# Patient Record
Sex: Male | Born: 1945 | Race: White | Hispanic: No | Marital: Married | Smoking: Former smoker
Health system: Southern US, Community
[De-identification: ages and names within clinical notes are randomized; demographics above are authoritative.]

## PROBLEM LIST (undated history)

## (undated) DIAGNOSIS — M549 Dorsalgia, unspecified: Secondary | ICD-10-CM

## (undated) DIAGNOSIS — F341 Dysthymic disorder: Secondary | ICD-10-CM

## (undated) DIAGNOSIS — I10 Essential (primary) hypertension: Secondary | ICD-10-CM

## (undated) DIAGNOSIS — M5137 Other intervertebral disc degeneration, lumbosacral region: Secondary | ICD-10-CM

## (undated) DIAGNOSIS — F329 Major depressive disorder, single episode, unspecified: Secondary | ICD-10-CM

## (undated) DIAGNOSIS — K635 Polyp of colon: Secondary | ICD-10-CM

## (undated) DIAGNOSIS — F419 Anxiety disorder, unspecified: Secondary | ICD-10-CM

## (undated) DIAGNOSIS — K219 Gastro-esophageal reflux disease without esophagitis: Secondary | ICD-10-CM

## (undated) DIAGNOSIS — E119 Type 2 diabetes mellitus without complications: Secondary | ICD-10-CM

## (undated) DIAGNOSIS — M48061 Spinal stenosis, lumbar region without neurogenic claudication: Secondary | ICD-10-CM

## (undated) DIAGNOSIS — I471 Supraventricular tachycardia: Secondary | ICD-10-CM

## (undated) DIAGNOSIS — C3491 Malignant neoplasm of unspecified part of right bronchus or lung: Secondary | ICD-10-CM

## (undated) DIAGNOSIS — Q2381 Bicuspid aortic valve: Secondary | ICD-10-CM

## (undated) DIAGNOSIS — C44321 Squamous cell carcinoma of skin of nose: Secondary | ICD-10-CM

## (undated) DIAGNOSIS — I428 Other cardiomyopathies: Secondary | ICD-10-CM

## (undated) DIAGNOSIS — M2550 Pain in unspecified joint: Secondary | ICD-10-CM

## (undated) DIAGNOSIS — G894 Chronic pain syndrome: Secondary | ICD-10-CM

## (undated) DIAGNOSIS — M51379 Other intervertebral disc degeneration, lumbosacral region without mention of lumbar back pain or lower extremity pain: Secondary | ICD-10-CM

## (undated) DIAGNOSIS — G4730 Sleep apnea, unspecified: Secondary | ICD-10-CM

## (undated) DIAGNOSIS — K649 Unspecified hemorrhoids: Secondary | ICD-10-CM

## (undated) DIAGNOSIS — Q231 Congenital insufficiency of aortic valve: Secondary | ICD-10-CM

## (undated) DIAGNOSIS — E559 Vitamin D deficiency, unspecified: Secondary | ICD-10-CM

## (undated) DIAGNOSIS — R739 Hyperglycemia, unspecified: Secondary | ICD-10-CM

## (undated) DIAGNOSIS — E785 Hyperlipidemia, unspecified: Secondary | ICD-10-CM

## (undated) DIAGNOSIS — I4719 Other supraventricular tachycardia: Secondary | ICD-10-CM

## (undated) HISTORY — DX: Hyperlipidemia, unspecified: E78.5

## (undated) HISTORY — DX: Congenital insufficiency of aortic valve: Q23.1

## (undated) HISTORY — DX: Other supraventricular tachycardia: I47.19

## (undated) HISTORY — DX: Chronic pain syndrome: G89.4

## (undated) HISTORY — DX: Unspecified hemorrhoids: K64.9

## (undated) HISTORY — DX: Major depressive disorder, single episode, unspecified: F32.9

## (undated) HISTORY — DX: Hyperglycemia, unspecified: R73.9

## (undated) HISTORY — PX: OTHER SURGICAL HISTORY: SHX169

## (undated) HISTORY — DX: Supraventricular tachycardia: I47.1

## (undated) HISTORY — DX: Dysthymic disorder: F34.1

## (undated) HISTORY — DX: Vitamin D deficiency, unspecified: E55.9

## (undated) HISTORY — DX: Spinal stenosis, lumbar region without neurogenic claudication: M48.061

## (undated) HISTORY — DX: Gastro-esophageal reflux disease without esophagitis: K21.9

## (undated) HISTORY — DX: Other cardiomyopathies: I42.8

## (undated) HISTORY — DX: Bicuspid aortic valve: Q23.81

## (undated) HISTORY — DX: Type 2 diabetes mellitus without complications: E11.9

## (undated) HISTORY — DX: Pain in unspecified joint: M25.50

## (undated) HISTORY — DX: Sleep apnea, unspecified: G47.30

## (undated) HISTORY — DX: Polyp of colon: K63.5

## (undated) HISTORY — DX: Squamous cell carcinoma of skin of nose: C44.321

## (undated) HISTORY — DX: Other intervertebral disc degeneration, lumbosacral region: M51.37

## (undated) HISTORY — DX: Malignant neoplasm of unspecified part of right bronchus or lung: C34.91

## (undated) HISTORY — PX: CAROTID ENDARTERECTOMY: SUR193

## (undated) HISTORY — DX: Anxiety disorder, unspecified: F41.9

## (undated) HISTORY — DX: Other intervertebral disc degeneration, lumbosacral region without mention of lumbar back pain or lower extremity pain: M51.379

## (undated) HISTORY — DX: Essential (primary) hypertension: I10

## (undated) HISTORY — DX: Dorsalgia, unspecified: M54.9

---

## 1996-03-06 HISTORY — PX: CHOLECYSTECTOMY: SHX55

## 2001-03-06 HISTORY — PX: CAROTID ENDARTERECTOMY: SUR193

## 2003-09-21 ENCOUNTER — Inpatient Hospital Stay (HOSPITAL_COMMUNITY): Admission: RE | Admit: 2003-09-21 | Discharge: 2003-09-22 | Payer: Self-pay | Admitting: Vascular Surgery

## 2003-09-21 ENCOUNTER — Encounter (INDEPENDENT_AMBULATORY_CARE_PROVIDER_SITE_OTHER): Payer: Self-pay | Admitting: *Deleted

## 2004-09-07 HISTORY — PX: CARDIAC CATHETERIZATION: SHX172

## 2005-02-07 ENCOUNTER — Ambulatory Visit: Payer: Self-pay | Admitting: Internal Medicine

## 2005-02-07 ENCOUNTER — Inpatient Hospital Stay (HOSPITAL_BASED_OUTPATIENT_CLINIC_OR_DEPARTMENT_OTHER): Admission: RE | Admit: 2005-02-07 | Discharge: 2005-02-07 | Payer: Self-pay | Admitting: Cardiology

## 2005-02-21 ENCOUNTER — Ambulatory Visit: Payer: Self-pay | Admitting: Internal Medicine

## 2005-03-03 ENCOUNTER — Inpatient Hospital Stay (HOSPITAL_COMMUNITY): Admission: RE | Admit: 2005-03-03 | Discharge: 2005-03-05 | Payer: Self-pay | Admitting: Internal Medicine

## 2005-03-03 HISTORY — PX: INSERT / REPLACE / REMOVE PACEMAKER: SUR710

## 2005-03-03 HISTORY — PX: TEE WITH CARDIOVERSION: SHX5442

## 2005-05-05 ENCOUNTER — Ambulatory Visit: Payer: Self-pay | Admitting: Internal Medicine

## 2005-07-10 ENCOUNTER — Inpatient Hospital Stay (HOSPITAL_COMMUNITY): Admission: AD | Admit: 2005-07-10 | Discharge: 2005-07-13 | Payer: Self-pay | Admitting: Cardiology

## 2007-01-04 IMAGING — CR DG CHEST 2V
2 series · 2 of 2 positions shown · non-contrast
Comparison: 09/17/03.

CLINICAL DATA: 59-year-old male with recurrent atrial tachycardia.
 CHEST ? 2 VIEW:

[view not recorded (1 of 2)]
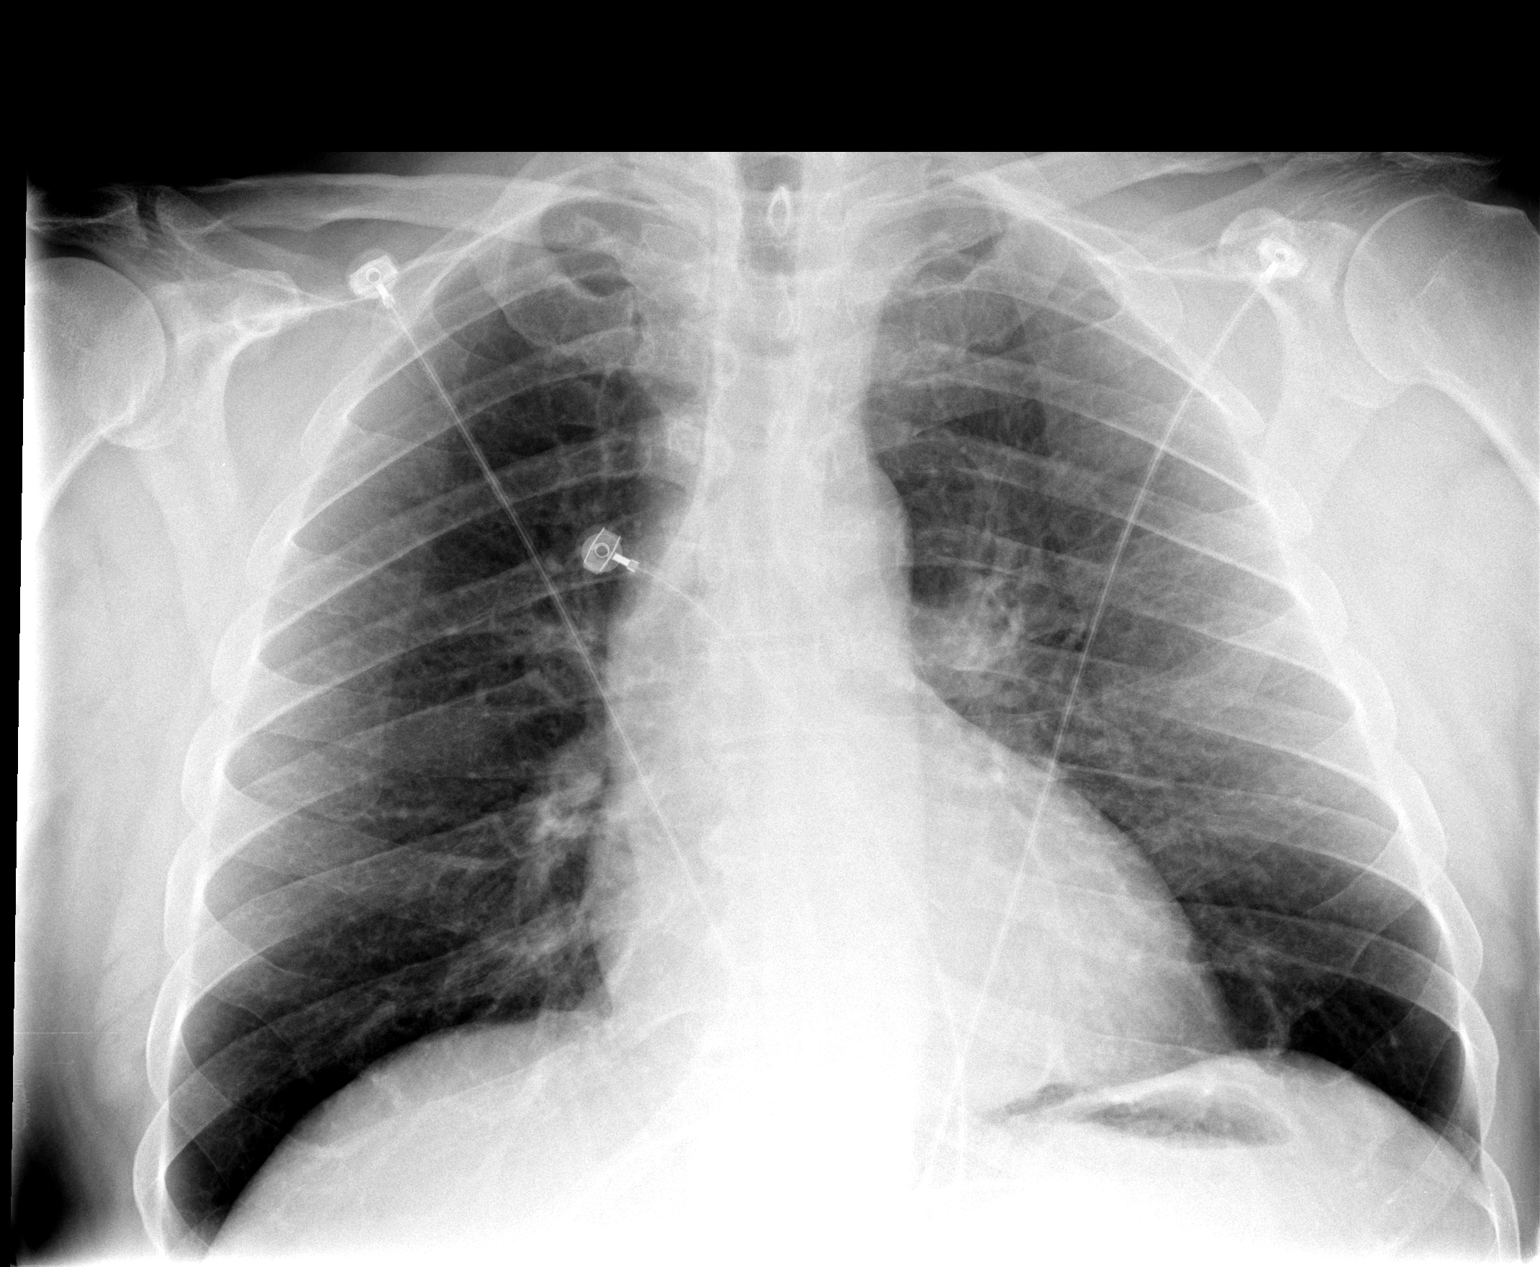

[view not recorded (2 of 2)]
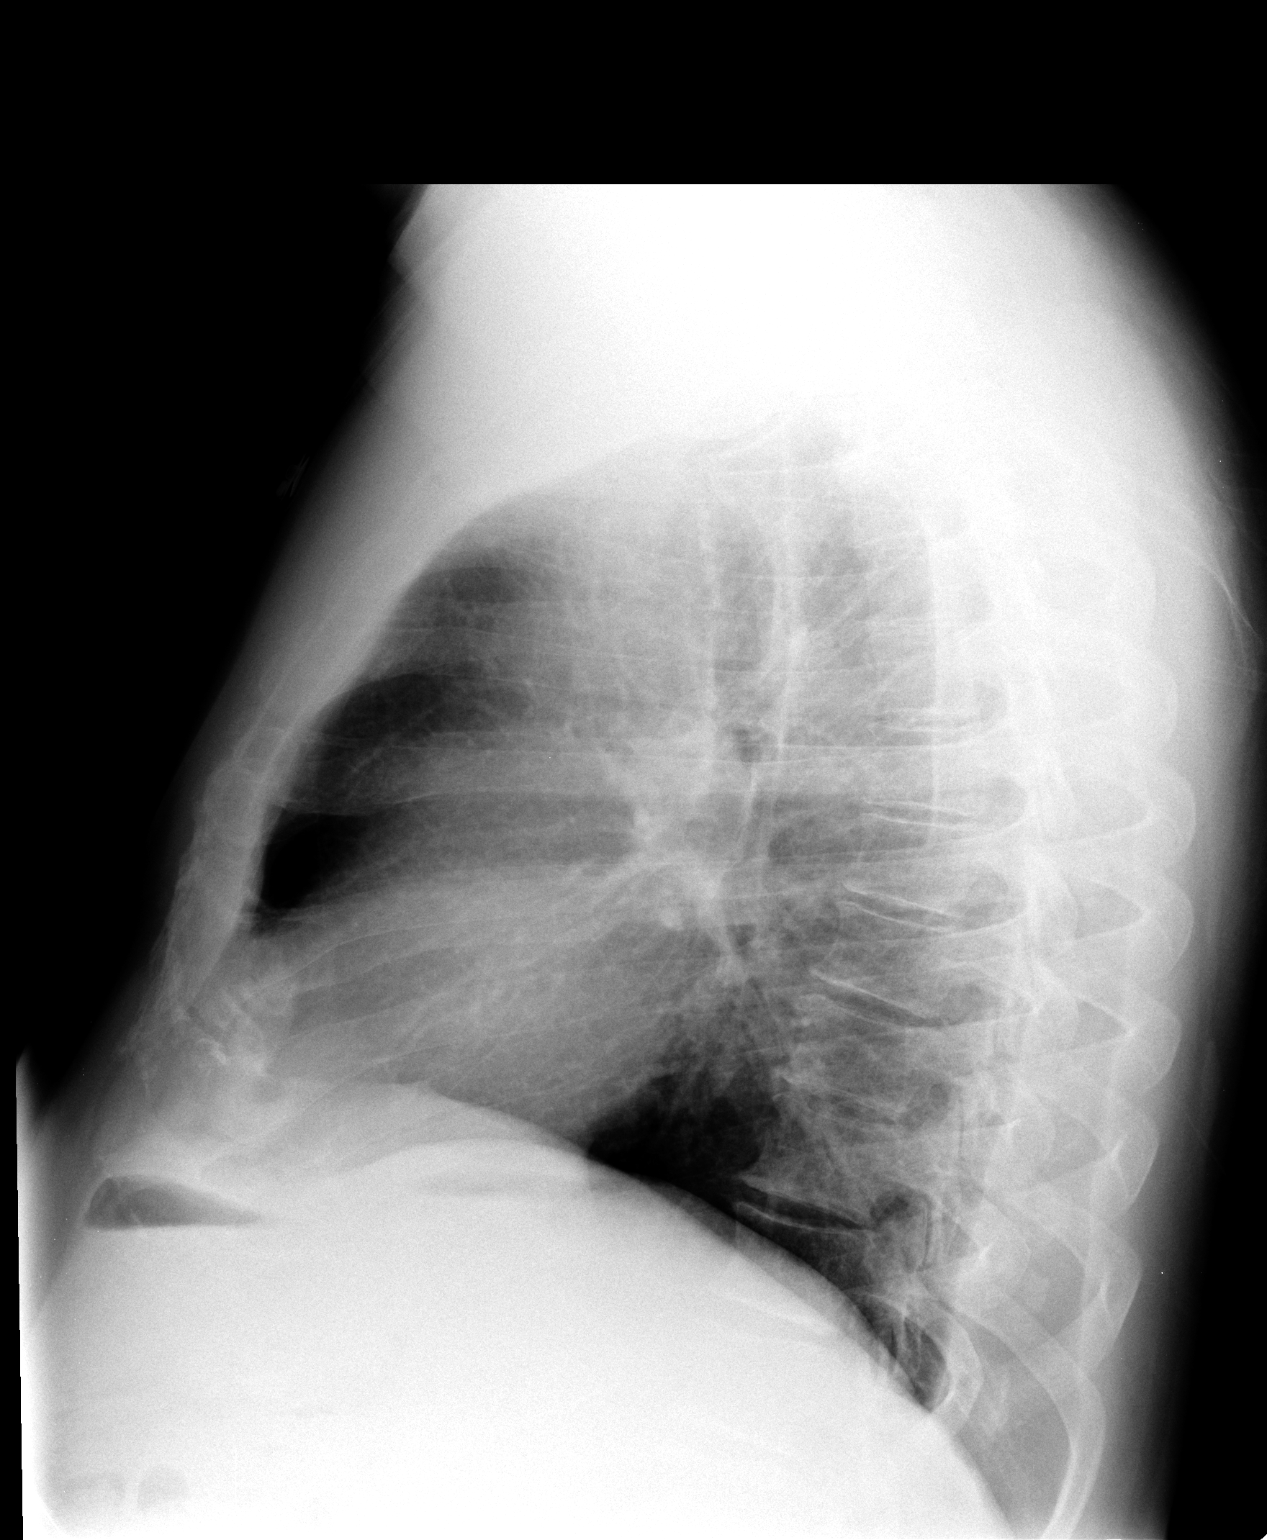

[2 of 2 positions shown; findings below may reference images not displayed]

FINDINGS: Mild hyperinflation and interstitial prominence.  These findings are stable.  Normal heart size.  No CHF, pneumonia, effusion, or pneumothorax.
IMPRESSION: Stable exam.  No acute chest disease.

## 2009-11-23 ENCOUNTER — Ambulatory Visit: Payer: Self-pay | Admitting: Cardiology

## 2009-11-23 ENCOUNTER — Ambulatory Visit (HOSPITAL_COMMUNITY): Admission: RE | Admit: 2009-11-23 | Discharge: 2009-11-23 | Payer: Self-pay | Admitting: Cardiology

## 2010-07-22 NOTE — Cardiovascular Report (Signed)
NAME:  Tanner Williamson, FULL NO.:  [id number]   MEDICAL RECORD NO.:  [id number]          PATIENT TYPE:  OIB   LOCATION:  1961                         FACILITY:  [facility]   PHYSICIAN:  Peter M. Swaziland, M.D.  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   PROCEDURES:  1.  Left heart catheterization coronary.  2.  Left ventricular angiography.   EQUIPMENT USED:  A 4-French 5-cm left coronary catheter, a 4-French 4-cm  right Judkins catheter, a 4-French pigtail catheter, a 4-French arterial  sheath.   ACCESS:  Via right femoral artery using standard Seldinger technique.   MEDICATIONS:  Local anesthesia 1% Xylocaine.   CONTRAST:  Omnipaque 90 cc.   HEMODYNAMIC DATA:  1.  Aortic pressure was 109/50 with a mean of 72-mmHg.  2.  Left ventricular pressures 110 with an EDP of 10-mmHg.   ANGIOGRAPHIC DATA:  1.  The left coronary arises and distributes normally.  2.  The left main coronary is normal.  3.  Left anterior descending artery has mild 20% disease proximally.  There      are minor irregularities less than 10% throughout vessel.  4.  The left circumflex coronary is normal.  5.  Right coronary has 10-20% irregularities throughout the vessel but no      obstructive disease noted.   LEFT VENTRICULAR ANGIOGRAPHY:  Was performed in RAO view.  This demonstrates  mild left ventricular dilatation.  There is global hypokinesia with overall  ejection fraction estimated at 40%.  There is no mitral regurgitation.   FINAL INTERPRETATION:  1.  No significant obstructive coronary disease.  2.  Moderate left ventricular dysfunction.   PLAN:  1.  Will continue medical therapy.  2.  Consider for EP  evaluation.           ______________________________  Peter M. Swaziland, M.D.     PMJ/MEDQ  D:  02/07/2005  T:  02/07/2005  Job:  [id number]   cc:   William Dalton, Dr.  [facility]   Quita Skye. Hart Rochester, M.D.  [address]  [place]  [place] [address]   Duke Salvia, M.D.  [address]  [address]  [place]  [place] [address]

## 2010-07-22 NOTE — Op Note (Signed)
NAMEGRADY, Tanner Williamson NO.:  [id number]   MEDICAL RECORD NO.:  [id number]          PATIENT TYPE:  INP   LOCATION:  2309                         FACILITY:  [facility]   PHYSICIAN:  Duke Salvia, M.D.  DATE OF BIRTH:  December 31, 1945   DATE OF PROCEDURE:  03/03/2005  DATE OF DISCHARGE:                                 OPERATIVE REPORT   PREPROCEDURE DIAGNOSIS:  Supraventricular long __________  tachycardia.   POSTPROCEDURE DIAGNOSIS:  Septal atrial tachycardia.   PROCEDURES PERFORMED:  1.  Invasive electrophysiological study.  2.  Arrhythmia mapping.  3.  Radiofrequency catheter ablation.   CARDIOLOGIST:  Duke Salvia, M.D.   COMPLICATIONS:  Transient complete heart block and with persistent  Wenckebach, and first degree AV block.   DESCRIPTION OF PROCEDURE:  Following the obtainment of informed consent the  patient was brought to the electrophysiology laboratory and placed on  fluoroscopic table in the supine position.  After cardiac catheterization  was performed local anesthesia and conscious sedation noninvasive blood  pressure monitoring, transcutaneous oxygen saturation monitoring and end-  tidal CO2 monitoring were performed continuously throughout the procedure.   Following the procedure the right-sided sheaths were removed in anticipation  of temporary transvenous pacing.   CATHETERS:  Five French quadripolar catheter was inserted via the left  femoral vein to the AV junction  __________  electrogram.  Six French  octapolar catheter was inserted via the left femoral vein to the right  ventricular apex.  A 7 French octapolar catheter was inserted into the right  femoral vein to the coronary sinus.  An 8 [place] octapolar catheter was  inserted via the left femoral vein to the tricuspid annulus.  An 8 French 5  mm __________  ablation catheter was inserted into the right femoral vein to  mapping sites in the posterior septal and midseptal spaces.  Surface  leads  1, aVF and V1 were monitored continuously throughout the procedure.   Following insertion of the catheters the stimulation protocol included  incremental atrial pacing. Atrial overdrive pacing.  Ventricular  stimulation.   RESULTS:   SURFACE ELECTROCARDIOGRAM:  Surface electrocardiogram at basic intervals.  Initial rhythm - atrial tachycardia.  RR interval 477 msec.  PR interval 115 msec.  QRS duration 97 msec.  QT interval 306 msec.  P-wave duration 87 msec.  H-A interval was __________  msec.  H-V interval was 56 msec.  His bundle duration was 16 msec.   FINAL:  Rhythm - atrial tachycardia with Mobitz's I heart block.  RR interval 638 msec.  PR interval as noted above with minimal p.r.n. interval at 162 msec.  QRS duration 95 msec.  QT interval 350 msec.  P wave 118 msec.  A-H interval and/A interval msec.   AV Wenckebach preprocedure was noted to be right around 600; intermittently  between 500 and 600 msec with occasional dropped beats.   Following the procedure A-V nodal conduction had four to three Wenckebach at  the atrial cycle length of 477 msec particularly with the patient sedated;  with the patient more awake 1:1 conduction was seen.  Following the procedure ventricular conduction was dissociated.   Arrhythmias induced:  An atrial tachycardia was identified in the low-to-mid  septum just cephalad to the coronary sinus os.  At this location the P wave  was -40 msec to the onset of the surface QRS.  Lower down the was an R wave  on the unipolar electrogram.  As we moved more to the midseptum the R wave  was lost, although there was some flattening of  the Q wave.  Fluoroscopy  demonstrated that we were about 1 cm away from the His recording.   Radiofrequency energy was applied at this location with gradual up titration  of temperature starting at 540 degrees.  A couple of occasional dropped  beats were seen; however, as we had seen these preablation  also  radiofrequency was continued for another five seconds.  At 9, 16, 34-35 we  then had consecutively dropped P waves and radiofrequency energy was  discontinued within a second.  Complete heart block was manifested.  There  was recovery of conduction that has been variable since then.   IMPRESSION:  1.  Atrial tachycardia - incessant, septally located.  2.  Unknown sinus function.  3.  Initial antegrade A-V nodal function was relatively normal with 1:1      conduction at the start of the case, although variably we saw dropped      beats and evidence of some Wenckebach. __________ .  4.  Radiofrequency energy clearly damaged the A-V nodal inputs with      transient complete heart block and then subsequent Mobitz's I.  5.  Normal His Purkinje system function.  6.  No accessory pathway.  7.  Normal ventricular response to programmed stimulation.   SUMMARY AND CONCLUSION:  The results of the electrophysiological testing  confirmed an atrial tachycardia that was originating from the septum  somewhere just inferior to the His and superior to the coronary sinus os.  Unfortunately applications of radiofrequency energy in this location where  he had electrogram -40 with the onset of surface P was associated with  damage to the antegrade A-V nodal conduction.   Because of that we will plan to place a temporary transvenous pacemaker as  we will have to watch to see how it is if this recovers.   The patient tolerated the procedure otherwise without difficulty.           ______________________________  Duke Salvia, M.D.     SCK/MEDQ  D:  03/03/2005  T:  03/04/2005  Job:  [id number]   cc:   Peter M. Swaziland, M.D.  Fax: [phone]   Electrophysiology Laboratory

## 2010-07-22 NOTE — Op Note (Signed)
NAME:  Tanner Williamson, Tanner Williamson                         ACCOUNT NO.:  [id number]   MEDICAL RECORD NO.:  [id number]                   PATIENT TYPE:  INP   LOCATION:  2899                                 FACILITY:  [facility]   PHYSICIAN:  Tanner Williamson, M.D.               DATE OF BIRTH:  1945/04/03   DATE OF PROCEDURE:  09/21/2003  DATE OF DISCHARGE:                                 OPERATIVE REPORT   PREOPERATIVE DIAGNOSIS:  Severe left internal carotid stenosis --  asymptomatic.   POSTOPERATIVE DIAGNOSIS:  Severe left internal carotid stenosis --  asymptomatic.   OPERATION:  Left carotid endarterectomy with Dacron patch angioplasty.   SURGEON:  Tanner Williamson, M.D.   FIRST ASSISTANT:  Carolyn A. Eustaquio Boyden.   ANESTHESIA:  General endotracheal.   BRIEF HISTORY:  This patient was found to have a left carotid bruits which  radiated noise into his left ear.  He subsequently underwent Duplex  scanning, which revealed a severe left internal carotid stenosis  approximating 90%, which was asymptomatic otherwise.  He had mild right  carotid occlusive disease and scheduled for left carotid endarterectomy.   PROCEDURE:  The patient was taken to the operating room and placed in a  supine position, at which time satisfactory general endotracheal anesthesia  was administered.  The left neck was prepped with Betadine scrubbing  solution and draped in routine sterile manner.  An incision was made along  the anterior border of the sternocleidomastoid muscle and carried down  through subcutaneous tissue and platysma using a Bovie.  The common facial  vein and external jugular vein were ligated with 3-0 silk ties and divided,  exposing the common, internal and external carotid arteries.  Care was taken  not to injure the vagus or hypoglossal nerves, both of which were exposed.  There was a calcified atherosclerotic plaque at the carotid bifurcation,  extending up the internal carotid about 3-4 cm; the  distal vessel appeared  normal.  A #10 shunt was prepared and the patient was heparinized.  Carotid  vessels were occluded with vascular clamps and a longitudinal opening made  in the common carotid with a 15 blade and extended up the internal carotid  with the Potts scissors to a point distal to the disease.  The plaque was  severely stenotic and ulcerated, but the distal vessel appeared normal.  A  #10 shunt was inserted without difficulty, reestablishing flow in about 2  minutes.  Standard endarterectomy was then performed using the elevator and  the Potts scissors with an eversion endarterectomy of the external carotid.  The plaque feathered off the distal internal carotid artery nicely, not  requiring any tacking sutures.  The lumen was thoroughly irrigated with  heparin and saline and all loose debris carefully removed and arteriotomy  was closed with a patch using continuous 6-0 Prolene.  Prior to completion  of the closure, the shunt was  removed after about 30 minutes of shunt time.  Following antegrade and retrograde flushing the closure was completed with  reestablishment of flow initially up the external and up the  internal branch.  Carotid was occluded for less than 2 minutes for removal  of the shunt.  Protamine was the given to reverse the heparin.  Following  adequate hemostasis, the wound was irrigated with saline, closed in layers  with Vicryl in a subcuticular fashion, sterile dressing applied and patient  taken to recovery room in satisfactory condition.                                               Tanner Skye Hart Williamson, M.D.    JDL/MEDQ  D:  09/21/2003  T:  09/21/2003  Job:  [id number]

## 2010-07-22 NOTE — Op Note (Signed)
NAMEJOSEHUA, HAMMAR NO.:  [id number]   MEDICAL RECORD NO.:  [id number]          PATIENT TYPE:  INP   LOCATION:  2309                         FACILITY:  [facility]   PHYSICIAN:  Duke Salvia, M.D.  DATE OF BIRTH:  10-08-1945   DATE OF PROCEDURE:  03/03/2005  DATE OF DISCHARGE:                                 OPERATIVE REPORT   PROCEDURE PERFORMED:  Temporary permanent transvenous pacemaker insertion.   CARDIOLOGIST:  Duke Salvia, M.D.   DESCRIPTION OF THE PROCEDURE:  Following the aforementioned complication of  iatrogenic heart block the sheaths from the right femoral venous area were  sterilized and removed.  A 7 French tearaway introducer sheath was placed  through which was then passed a St. Jude 1688T, 100 cm active fixation  ventricular lead , serial number [id number].  It was placed in the right  ventricular apex where the bipolar R wave with 11 mVolts and a pacing  impedance of 10, 24 ohms with a threshold of 1 volt at 0.5 msec.  It was  attached to a temporary permanent Identity XL pulse generator that was  programmed VVI at 30 beats/minute.  The lead was secured to the skin.  OpSite dressing was applied.   The patient was then transferred to the holding area in stable condition.   Antibiotics will be administered.  We will plan to watch the patient over  the next 24-48 hours to see how it is that the A-V nodal conduction  recovers.           ______________________________  Duke Salvia, M.D.     SCK/MEDQ  D:  03/03/2005  T:  03/04/2005  Job:  [id number]

## 2010-07-22 NOTE — Discharge Summary (Signed)
NAMEJOWEL, [name] NO.:  [id number]   MEDICAL RECORD NO.:  [id number]          PATIENT TYPE:  INP   LOCATION:  2309                         FACILITY:  [facility]   PHYSICIAN:  Duke Salvia, M.D.  DATE OF BIRTH:  February 16, 1946   DATE OF ADMISSION:  03/03/2005  DATE OF DISCHARGE:  03/05/2005                                 DISCHARGE SUMMARY   PRINCIPAL DIAGNOSES:  1.  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  2.  Septal atrial tachycardia.  3.  Status post electrophysiology/radiofrequency catheter ablation with      transient damage to the AV nodal conduction system.  4.  Status post implant of temporary permanent transvenous pacemaker      December 29.  5.  Referred post procedure to [facility].  6.  No rate-controlling drugs at discharge.   PROCEDURES:  1.  March 03, 2006, electrophysiology study, attempted radiofrequency      catheter ablation of septal atrial tachycardia, aborted secondary to      transient AV nodal injury.  2.  Insertion of temporary permanent transvenous pacemaker, Duke Salvia,      M.D., March 03, 2006.   BRIEF HISTORY:  Mr. Tanner Williamson is a 65 year old male.  He has a  longstanding history of tachypalpitations, reflected possibly as a  tachycardia-induced cardiomyopathy.  He presents for electrophysiology study  and radiofrequency catheter ablation of what is probably an atrial  tachycardia.  Risks and benefits of the procedure have been explained to the  patient and he wishes to proceed.   HOSPITAL COURSE:  The patient presented to [facility] electively  March 03, 2005.  He underwent electrophysiology study and radiofrequency  catheter ablation for a septal atrial tachycardia at the same site which was  inferior to the His and superior to the coronary sinus.  There was  origination of his atrial tachycardia; however, radiofrequency energy  in  this area gave associated damage to antegrade AV nodal conduction.  The  patient underwent placement of a temporary permanent transvenous pacemaker  and 36 hours of observation.  After 16 hours' observation, he was  demonstrating 1:1 conduction of a transient complete heart block.  A further  24 hours of observation demonstrated signs of atrial tachycardia with  occasional WCB consistent with preprocedure cardiac function.  There was  also sinus rhythm with first degree AV block with a PR greater than or equal  to 220 msec.  The temporary pacemaker was removed.  The patient had adequate  blood pressure of 110/70, continued independent coordinate cardiac function  and discharged on December 31.  Dr. Sherryl Manges will contact [facility] to involve them in continuance of this patient's  care and atrial tachyarrhythmia.  The patient had concluded a taper of  prednisone to mitigate swelling in the endocardium.   The patient discharges on:   1.  Accupril 10 mg daily.  2.  Aspirin 325 mg daily.  3.  Ultram 50 mg two to three tablets two to three times daily.  4.  Atenolol 25 mg daily.  5.  Cardizem SR 240 mg t.i.d. has been stopped   The patient will follow up with Dr. Graciela Husbands.  His appointments will be made  and also with the [facility].      Tanner Williamson, P.A.    ______________________________  Duke Salvia, M.D.    GM/MEDQ  D:  05/08/2005  T:  05/09/2005  Job:  [id number]   cc:   Peter M. Swaziland, M.D.  Fax: [phone]

## 2010-07-22 NOTE — H&P (Signed)
NAME:  Tanner Williamson, Tanner Williamson                         ACCOUNT NO.:  [id number]   MEDICAL RECORD NO.:  [id number]                   PATIENT TYPE:  INP   LOCATION:  NA                                   FACILITY:  [facility]   PHYSICIAN:  Quita Skye. Hart Rochester, M.D.               DATE OF BIRTH:  1945/07/10   DATE OF ADMISSION:  09/21/2003  DATE OF DISCHARGE:                                HISTORY & PHYSICAL   PRIMARY CARE PHYSICIAN:  Shellia Cleverly, M.D.   This is a 65 year old Caucasian male who was referred from Dr. Shelva Majestic for  evaluation of severe left carotid occlusive disease.  Tanner Williamson presented  with radiation of his heartbeat into his left ear.  A Duplex scan was  performed which revealed severe left internal carotid artery stenosis.  Tanner Williamson  has remained asymptomatic from this.  Tanner Williamson was evaluated by Dr. Hart Rochester of the  CVTS office on August 20, 2003.  Duplex carotid scan was repeated that day and  confirmed a greater than 90% left internal carotid artery stenosis.  Dr.  Hart Rochester recommended left carotid endarterectomy for stroke risk reduction.  The procedure risks and benefits were discussed with Tanner Williamson and Tanner Williamson  agreed to proceed with surgery.   ALLERGIES:  No known drug allergies.  However, Tanner Williamson is intolerant to TYLENOL  NO. 3 which gives him a splitting headache.   MEDICATIONS PRIOR TO ADMISSION:  1. Accupril 10 mg daily.  2. Aspirin 325 mg daily.  3. Ultram 50 mg two daily.  4. Aleve p.r.n.   PAST MEDICAL HISTORY:  1. Hypertension.  2. Longstanding heart murmur, last evaluated with echocardiogram  March     2005.  Ejection fraction estimated to be 55 to 60% with normal     contractility and aortic valve insufficiency.   PAST SURGICAL HISTORY:  1. Cholecystectomy.  2. Colonoscopy.   Tanner Williamson specifically denies any history of coronary disease, diabetes, kidney  disease, thyroid disease or dyslipidemia.   SOCIAL HISTORY:  Tanner Williamson is married.  Tanner Williamson and his wife have one adult  child.  Tanner Williamson is  employed with __________ of [place], [place].  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 Tanner Williamson continues to smoke one to two packs a day  for the last 40 years.  Tanner Williamson has tried to quit in the past.  Tanner Williamson used nicotine  patches with smoking cessation for three months that Tanner Williamson used the patch.  Tanner Williamson  notes that Tanner Williamson would like to restart the nicotine patch during this  hospitalization.  Tanner Williamson lives with his wife in a single level dwelling.  Mrs.  [name] will be available for assistance after discharge from the  hospital.   FAMILY HISTORY:  Negative for coronary disease, stroke or diabetes.   REVIEW OF SYMPTOMS:  Tanner Williamson reports that Tanner Williamson feels pretty good overall,  although the last few months Tanner Williamson has not felt as  peppy as usual.  Tanner Williamson has had  no recent change in vision or hearing.  Tanner Williamson has no difficulties chewing or  swallowing.  Tanner Williamson does hear his heartbeat in his left ear.  Tanner Williamson does not wear  eyeglasses.  Tanner Williamson has dentures on his upper jaw.  No dyspnea on exertion.  No  cough.  Tanner Williamson does sleep with two pills.  No complaints of chest pain. No pedal  edema.  His weight has been stable.  His appetite is good.  Tanner Williamson has had no  recent changes in his bowel habits.  Tanner Williamson follows a regular diet.  No GU  complaints.  No recent dizziness, syncope, no falls.  Tanner Williamson does have some  chronic back pain.  Tanner Williamson does ambulate independently and does not use any  assistive devices.  No complaints of claudication.  No skin rashes or  breakdown.  No recent fevers or infection.   PHYSICAL EXAMINATION:  GENERAL APPEARANCE:  This is a 65 year old Caucasian  male in no acute distress.  Tanner Williamson is pleasant and cooperative with his  examination.  VITAL SIGNS:  Blood pressure 110/68, heart rate 64.  Tanner Williamson is 5 feet 8 inches  tall, weighs approximately 200 pounds.  HEENT:  Eyes:  PERRLA, EOMI.  Oral mucosa is pink and moist.  NECK:  His neck is with a full range of motion.  Tanner Williamson has a left carotid  bruit, no bruit on the right.  No thyromegaly  or lymphadenopathy.  LUNGS:  His breathing is unlabored.  His breath sounds are clear  bilaterally.  CARDIOVASCULAR:  His heart is in a regular rate and rhythm.  There is a 2/6  systolic murmur noted.  ABDOMEN:  His abdomen is rotund with normal active bowel sounds, soft and  nontender.  EXTREMITIES:  Lower extremities are without edema, varicosities or  venostasis changes.  Tanner Williamson has 2+ radial pulses bilaterally.  Tanner Williamson has 2+  posterior tibial pulses bilaterally.  NEUROLOGIC:  Tanner Williamson is alert and oriented.  Cranial nerves II-XII grossly  intact.  Tanner Williamson does have frequent facial ticks.  His gait is steady.  Tanner Williamson has  full range of motion in all of his extremities.  Muscle strength is 5/5 in  all extremities.   LABORATORY DATA:  Laboratory studies are pending and will include CBC, BMET,  PT, INR and a urinalysis.   IMPRESSION:  This is a 65 year old man with asymptomatic, severe left  internal carotid artery stenosis.   PLAN:  Elective admission to [place] under the care  of Dr. Hart Rochester on September 21, 2003, for a left carotid endarterectomy.  Will  plan to start nicotine transdermal after surgery.      Toribio Harbour, N.P.                  Quita Skye Hart Rochester, M.D.    CTK/MEDQ  D:  09/17/2003  T:  09/17/2003  Job:  [id number]   cc:   Pushp Claudius  [address]  [address]  [place]  [place] [address]  Fax: [phone]   Darrelyn Hillock  [address]  [place]  [place] [address]  Fax: [phone]

## 2010-07-22 NOTE — Consult Note (Signed)
NAME:  Tanner Williamson, Tanner Williamson NO.:  [id number]   MEDICAL RECORD NO.:  [id number]          PATIENT TYPE:  OIB   LOCATION:  1961                         FACILITY:  [facility]   PHYSICIAN:  Duke Salvia, M.D.  DATE OF BIRTH:  04-12-1945   DATE OF CONSULTATION:  02/07/2005  DATE OF DISCHARGE:  02/07/2005                                   CONSULTATION   Thank you very much for asking [place] to see Tanner Williamson in  electrophysiological consultation for tachycardia and associated  cardiomyopathy.   Tanner Williamson is a 66 year old gentleman who has a cardiac history that dates  back about eight or nine years.  He had taken a Fen-Phen for a weight loss  period.  Because of that he was referred to cardiology.  He had had a long-  standing history of a heart murmur.  At that point he was noted to have both  an irregular heartbeat as well as an enlarged aorta.  He was put on  Accupril and then had no other significant issues.   He went in earlier this year for his physical examination and was noted by  his family physician to have a rapid heartbeat.  He was referred back to  cardiology in [place] where a rapid tachycardia was identified on Holter  monitoring.  Atenolol was initiated with gradual up titration without  significant benefit.  The patient's main symptoms with this were fatigue.  He denied dyspnea, palpitations, presyncope, or fluid overload.  There was  some anorexia/early satiety.   Because of persistent symptoms the patient was referred to Dr. Peter Swaziland  for further evaluation.  He undertook a Cardiolite scan that was notable in  two regards, one for an ejection fraction assessed at 29% with a perfusion  defect, and two with the initiation of stress, a rapid change in his heart  rate from 93 to 185 beats per minute.  As noted, the patient was unaware of  his palpitations.   The patient's medication regime was then adjusted with the addition of  Cardizem and the  sequence of events above is a little bit off, I think but  in any case, the Cardizem was initiated and over the last five days prior to  his catheterization today his heart rates have been in the 50s.  He feels a  good deal better.   PAST MEDICAL HISTORY:  As notable as above and includes:  1.  Hypertension.  2.  Peripheral vascular disease manifested by carotid disease status post      left carotid endarterectomy.  3.  Mild aortic insufficiency.   PAST SURGICAL HISTORY:  1.  Notable for his endarterectomy.  2.  Cholecystectomy.   CURRENT MEDICATIONS:  1.  Atenolol 25.  2.  Accupril 10.  3.  Aspirin 325.  4.  Diltiazem 240 b.i.d.   ALLERGIES:  CODEINE.   SOCIAL HISTORY:  He is married.  He has one child.  He smokes a pack of  cigarettes a day.  He does not use alcohol.  He works with [employer], [employer] as  a paper cutter.   FAMILY HISTORY:  Noncontributory.   REVIEW OF SYSTEMS:  Also noncontributory.   PHYSICAL EXAMINATION:  GENERAL:  He is a middle-aged Caucasian male  appearing his stated age of 69.  VITAL SIGNS:  Blood pressure 123/74 with a pulse of 53.  His respirations  were 16.  HEENT:  No icterus.  No xanthomata.  The neck veins were flat.  The carotids  were brisk and full without bruits.  BACK:  Without kyphosis, scoliosis.  LUNGS:  Clear.  HEART:  Sounds were regular.  There was a systolic murmur appreciated.  I  did not hear a diastolic murmur.  ABDOMEN:  Soft with active bowel sounds without midline pulsation or  hepatomegaly.  EXTREMITIES:  Femoral pulses were not examined.  Distal pulses were intact.  There was no clubbing, cyanosis, edema.  NEUROLOGIC:  Grossly normal.  SKIN:  Warm and dry.   LABORATORY DATA:  Hemoglobin of 15.9, potassium 4.6, creatinine 1.1.   Electrocardiogram dated today demonstrated normal sinus rhythm with rates in  the 55s with normal vectors.   The electrocardiogram from his stress test at the initiation demonstrated a   2:1 block with an atrial tachycardia.  P-wave was upright in lead V1,  upright in lead V1, upright in lead aVL, upright in lead aVR, and inverted  in the inferior leads.  He then went from 2:1 to 1:1 conduction.   IMPRESSION:  1.  Atrial tachycardia coming probably from the low right atrium with 1:1      and 2:1 conduction.  2.  Tachycardia-induced cardiomyopathy (hopefully) with a recent change in      his ejection fraction of 29 to 49% (hopefully real) associated with a      better rate control.  3.  Hypertension.  4.  A history of aortic enlargement.  5.  Status post endarterectomy.  6.  Fen-Phen exposure.  7.  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   DISCUSSION:  Mr. Tanner Williamson has an atrial tachycardia and likely cardiomyopathy  related to rapid rates.  He is currently better rate controlled with Dr.  Elvis Coil medical regime.  I have discussed treatment options including  lifelong AV nodal blocking agents versus __________ drug therapy and the  role of RX catheter ablation with EP testing.  We discussed potential  benefits as well as potential risks of the latter including, but not limited  to death, perforation, heart block requiring pacemaker implantation.  He and  his wife and his daughter would like to proceed.   Review of schedules as we approach Christmas left the 29th of December as  the best mutual option.   He is advised to call us in the event that he has recurrent rates in the 90s  or faster than that.   Thank you for the consultation.           ______________________________  Duke Salvia, M.D.     SCK/MEDQ  D:  02/07/2005  T:  02/07/2005  Job:  [id number]   cc:   Lambert Keto, M.D.  [address]  [place], [place] [address]   Quita Skye. Hart Rochester, M.D.  [address]  [place]  [place] [address]

## 2010-07-22 NOTE — H&P (Signed)
NAME:  Tanner Williamson, Tanner Williamson NO.:  [id number]   MEDICAL RECORD NO.:  [id number]           PATIENT TYPE:   LOCATION:                                 FACILITY:   PHYSICIAN:  Peter M. Swaziland, M.D.       DATE OF BIRTH:   DATE OF ADMISSION:  DATE OF DISCHARGE:                                HISTORY & PHYSICAL   DATE OF ADMISSION:  Jul 10, 2005   HISTORY OF PRESENT ILLNESS:  Mr. 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  He is admitted for initiation of antiarrhythmic drug  therapy.  The patient was initially diagnosed with this condition November  2006.  He had been having off and on symptoms for approximately 7 months  prior to this.  He was placed on atenolol initially but had no improvement  in his symptoms.  He subsequently had addition of Diltiazem and increasing  beta blocker therapy but continued to have persistent episodes of rapid  tachycardia up to a rate of 185-190.  He had an adenosine cardiac Cardiolite  study which showed a fixed inferior wall defect with global hypokinesia and  ejection fraction of 29%.  He subsequently underwent cardiac catheterization  which demonstrated minor nonobstructive coronary disease.  He had global  hypokinesia with overall ejection fraction of 40%.  He was then referred for  EP study by Dr. Sherryl Manges.  He underwent attempt at radiofrequency  catheter ablation.  However, this resulted in transient complete heart block  due to the proximity of the ectopic focus to the AV node.  Ablative efforts  had to be stopped at that time.  It was unsuccessful in treating the  tachycardia.  He was subsequently referred to [facility] where  he underwent attempt at cryoablation on February 2007.  He had multiple  ablative therapies at that time and initially was felt to be successful but  subsequent to that he has had recurrent  tachypalpitation with documented  ectopic atrial tachycardia with rates up to 135 beats per minute on beta  blocker therapy.  He has had persistent symptoms of tachypalpitations,  dyspnea and marked fatigue.  His last echocardiogram on May 12, 2005, did  show improvement in his left ventricular function.  He did have mild left  ventricular enlargement and left ventricular hypertrophy with global  hypokinesia and ejection fraction of 45-50%.  He also has a bicuspid aortic  valve with mild to moderate aortic insufficiency.  Given the inability to  cure this arrhythmia with ablative therapies, and given his intolerance to  rate controlling medications alone, he is now admitted for antiarrhythmic  drug therapy.   PAST MEDICAL HISTORY:  1.  Hypertension.  2.  Chronic aortic insufficiency.  3.  Status post left carotid endarterectomy.  4.  Status post cholecystectomy.   He is allergic to CODEINE.   CURRENT MEDICATIONS:  1.  Accupril 10 mg per day.  2.  Aspirin 325 mg per day.  3.  Toprol-XL 50 mg per  day.   SOCIAL HISTORY:  The patient works for a [employer] as a  Designer, multimedia.  He is married, has one child in good health.  He smokes half  a pack per day.  He has been a smoker for 40 years.  He denies alcohol use.   FAMILY HISTORY:  Father is age 73 in good health.  Mother is age 62 and has  Parkinson's disease and Alzheimer's.  One brother is age 15 in good health.   REVIEW OF SYSTEMS:  He had no history of syncope or near-syncope.  He has  had no history of TIA or stroke.  He denies any claudication symptoms.  He  has had no bleeding problems.  Other review of systems are negative.   PHYSICAL EXAMINATION:  GENERAL:  The patient is a middle-aged white male in  no apparent distress.  VITAL SIGNS:  His weight is 213, blood pressure is 138/70, pulse 72 and  regular, respirations were 20.  HEENT:  Normocephalic, atraumatic.  Pupils equal, round, reactive to light   and accommodation.  Extraocular movements are full.  Oropharynx is clear.  NECK:  Supple without JVD, adenopathy, thyromegaly or bruits.  He has an old  left CEA scar.  LUNGS:  Clear to auscultation and percussion.  CARDIAC:  Reveals regular rate and rhythm without murmur, rub, gallop or  click.  ABDOMEN:  Soft and nontender.  He has no hepatosplenomegaly, masses or  bruits.  EXTREMITIES:  Femoral and pedal pulses are 2+ and symmetric.  He has no  edema.  NEUROLOGIC:  Exam is intact.   LABORATORY DATA:  Pending.   IMPRESSION:  1.  Recurrent ectopic atrial tachycardia status post two unsuccessful      ablative procedures.  The patient is still symptomatic.  2.  Tachycardia mediated cardiomyopathy.  3.  Bicuspid aortic valve with mild to moderate aortic insufficiency.  4.  Hypertension.  5.  Status post left carotid endarterectomy.   PLAN:  The patient will be admitted to telemetry.  Will obtain baseline lab  data.  Will reduce his beta blocker dose and started him on Tikosyn per  protocol.           ______________________________  Peter M. Swaziland, M.D.     PMJ/MEDQ  D:  07/10/2005  T:  07/10/2005  Job:  [id number]   cc:   Duke Salvia, M.D.  [address]  [address]  [place]  [place] [address]   Quita Skye. Hart Rochester, M.D.  [address]  [place]  [place] [address]   Lambert Keto, M.D.  [facility]  [address]  [place], [place] [address]

## 2010-07-22 NOTE — H&P (Signed)
NAMEADEWALE, PUCILLO NO.:  [id number]   MEDICAL RECORD NO.:  [id number]          PATIENT TYPE:  INP   LOCATION:  2309                         FACILITY:  [facility]   PHYSICIAN:  Duke Salvia, M.D.  DATE OF BIRTH:  [birth year]   DATE OF ADMISSION:  03/03/2005  DATE OF DISCHARGE:  03/05/2005                                HISTORY & PHYSICAL   CARDIOLOGIST:  Dr. Peter Swaziland   ELECTROPHYSIOLOGIST:  Dr. Sherryl Manges   ALLERGIES:  CODEINE.   PRESENTING CIRCUMSTANCE:  I am here for my procedure.   BRIEF HISTORY:  Tanner Williamson is a 65 year old male.  He has a history  of tachy palpitations which have been recorded as an atrial tachycardia.  He  also has hypertension, extracranial cerebrovascular occlusive disease.  He  is status post left carotid endarterectomy.  He has mild aortic  insufficiency.  He has a previous exposure to Fen-Phen and PVCs emanate from  the right ventricular outflow tract.   The risks and benefits of electrophysiology study and radiofrequency  catheter ablation have been described to the patient.  Patient currently is  on rate controlled drugs which have not been able to subdue or terminate  tachycardia which is in some ways possible for tachycardia-induced  cardiomyopathy in this patient.   MEDICATIONS:  1.  Accupril 10 mg daily.  2.  Enteric-coated aspirin 325 mg daily.  3.  Ultram 50 mg two to three tablets two to three times daily.  4.  Atenolol 25 mg daily.  5.  Cardizem SR 240 mg t.i.d.   PAST MEDICAL HISTORY:  1.  Atrial tachycardia with tachycardia-induced cardiomyopathy.  2.  Hypertension.  3.  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  4.  Atrial aortic insufficiency.  5.  Fen-Phen exposure.  6.  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   PHYSICAL EXAMINATION:  VITAL SIGNS:  Temperature 97.6, heart rate 129,  respirations 18, blood pressure 138/84, oxygen saturation  96% room air,  height 5 feet 8 inches, weight 207.  GENERAL:  Patient is alert and oriented x3, no acute distress.  HEENT:  Atraumatic, normocephalic.  Eyes:  Pupils are equal, round, and  reactive to light.  Extraocular movements are intact.  Nares are patent.  Oropharynx shows no evidence of lesion, erythema.  Dentition is adequate.  NECK:  Supple.  No carotid bruits auscultated.  No cervical lymphadenopathy.  HEART:  Rapid and regular.  LUNGS:  Clear to auscultation, percussion bilaterally.  ABDOMEN:  Mildly obese, soft, nondistended.  Bowel sounds are present.  EXTREMITIES:  No evidence of clubbing, cyanosis, edema.  NEUROLOGIC:  No focal deficits noted.   IMPRESSION:  1.  Persistent atrial tachycardia.  2.  Possible tachycardia-mediated cardiomyopathy.   PLAN:  Electrophysiology study with radiofrequency catheter ablation of  atrial tachycardia.      Maple Mirza, P.A.    ______________________________  Duke Salvia, M.D.    GM/MEDQ  D:  05/08/2005  T:  05/09/2005  Job:  [id number]

## 2010-07-22 NOTE — H&P (Signed)
NAME:  Tanner Williamson, BROSH NO.:  [id number]   MEDICAL RECORD NO.:  [id number]           PATIENT TYPE:   LOCATION:                                 FACILITY:   PHYSICIAN:  Peter M. Swaziland, M.D.  DATE OF BIRTH:  06/30/1945   DATE OF ADMISSION:  02/07/2005  DATE OF DISCHARGE:                                HISTORY & PHYSICAL   HISTORY OF PRESENT ILLNESS:  Mr. Tanner Williamson is a 65 year old white male who was  seen for evaluation of tachy palpitations.  Patient has had a seven-month  history of problems with tachy palpitations and irregular pulse.  With this  he has complained of increasing fatigue.  He was initially evaluated by a  cardiologist in [place] who obtained a Holter monitor who felt that this  represented a paroxysmal supraventricular tachycardia.  On further review  this appears to be an ectopic atrial tachycardia.  He had an echocardiogram  performed in May which demonstrated mild aortic enlargement with mild  insufficiency.  He had normal left ventricular systolic function at that  time.  He was placed on atenolol up to 25 mg per day, but had noted no  improvement with his symptoms.  He denied any dizziness or syncope.  Thyroid  studies were unremarkable.  Patient does have a known history of peripheral  vascular disease and hypertension.  He is also a smoker.  He was  subsequently seen by myself in early November and started on diltiazem.  Subsequent evaluation with adenosine Cardiolite study showed a fixed  inferior wall defect consistent with scar versus diaphragmatic attenuation.  His left ventricular function was severely reduced with ejection fraction of  29%.  It was noted at the time of his stress test that he had ectopic atrial  tachycardia with heart rates up to 185 with minimal exertion.  He is now  admitted for cardiac catheterization.  Pending these results would recommend  EP study with ablation of his tachycardia.   PAST MEDICAL HISTORY:  1.   Hypertension.  2.  History of mild aortic insufficiency.  3.  Status post left carotid endarterectomy.  4.  Status post cholecystectomy.   ALLERGIES:  CODEINE.   CURRENT MEDICATIONS:  1.  Atenolol 25 mg per day.  2.  Accupril 10 mg per day.  3.  Aspirin 325 mg per day.  4.  Ultram two to three tablets daily.  5.  Diltiazem SR 240 mg b.i.d.   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  He is married.  He has one child in good health.  He smokes one  pack per day and has smoked for 40 years.  He denies alcohol use.  He has  recently reduced his caffeine intake.   FAMILY HISTORY:  His father is age 63, in good health.  Mother is age 63 and  has Parkinson's disease and Alzheimer's.  One brother is age 56, in good  health.   REVIEW OF SYSTEMS:  He denies orthopnea, PND, or edema.  He has had no chest  pain.  He has had no history of TIA or stroke.  Other review of systems are  negative.   PHYSICAL EXAMINATION:  GENERAL:  Patient is a pleasant white male in no  distress.  VITAL SIGNS:  His weight is 212.  Blood pressure is 130/80, pulse is 110 and  regular.  HEENT:  Pupils are equal, round, and reactive to light and accommodation.  Extraocular movements are full.  Oropharynx is clear.  NECK:  Without JVD, adenopathy, thyromegaly, or bruits.  LUNGS:  Clear.  CARDIAC:  Tachycardic rhythm without gallop.  There is a soft systolic  ejection murmur at the lower left sternal border grade 1/6.  ABDOMEN:  Soft and nontender.  He has no masses or bruits.  EXTREMITIES:  Without edema.  Pulses are 2+ and symmetric.  NEUROLOGIC:  Nonfocal.   LABORATORY DATA:  CBC shows a white count 10,400, hemoglobin 15.9,  hematocrit 47.5, platelets 209,000.  Sodium 138, potassium 4.6, chloride  108, CO2 28, glucose 103, BUN 13, creatinine 1.1.  Coags were normal.  ECG  shows an ectopic atrial tachycardia with nonspecific T-wave abnormality.   IMPRESSION:  1.   Ectopic atrial tachycardia poorly controlled by medical therapy.  2.  Cardiomyopathy with severe left ventricular dysfunction probably related      to tachycardia-induced cardiomyopathy.  Need to rule out ischemic      component.  3.  Status post left carotid endarterectomy.  4.  Hypertension.   PLAN:  Will proceed with cardiac catheterization.  If this is unremarkable I  have asked Berton Mount, M.D. to evaluate the patient for possible catheter  ablation of his tachycardia.           ______________________________  Peter M. Swaziland, M.D.     PMJ/MEDQ  D:  02/03/2005  T:  02/03/2005  Job:  [id number]   cc:   Lambert Keto, M.D.  [facility]  [address]  [address]  [place], [place] [address]   Tanner Skye. Williamson Rochester, M.D.  [address]  [place]  [place] [address]   Tanner Williamson, M.D.  [address]  [address]  [place]  [place] [address]

## 2010-07-22 NOTE — Discharge Summary (Signed)
NAME:  Tanner Williamson, Tanner Williamson NO.:  [id number]   MEDICAL RECORD NO.:  [id number]          PATIENT TYPE:  INP   LOCATION:  2011                         FACILITY:  [facility]   PHYSICIAN:  Peter M. Swaziland, M.D.  DATE OF BIRTH:  Dec 11, 1945   DATE OF ADMISSION:  07/10/2005  DATE OF DISCHARGE:  07/13/2005                                 DISCHARGE SUMMARY   HISTORY OF PRESENT ILLNESS:  Mr. 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  He is had tow successful ablative procedure including a  radiofrequency catheter ablation here which resulted in transient complete  heart block.  He was subsequently referred to [facility] for attempt at  cryoablation and although he had a long ablative of procedure, he still had  persistence of his ectopic atrial tachycardia with heart rates up into the  130s.  He has been symptomatic with marked fatigue and still dyspnea.  He  was admitted for initiation of antiarrhythmic drug therapy.  For details his  past medical history, social history, family history and physical exam  please see admission history and physical.   LABORATORY DATA:  ECG showed normal sinus rhythm with normal ECG.  His QTC  was 438 milliseconds.  His chest x-ray showed no active disease.  Sodium was  138, potassium 4, chloride 106, CO2 27, BUN 13, creatinine 1.1, glucose of  120.  LFTs were all normal.  Magnesium was 2.4.  Coags were normal.  White  count 8,700, hemoglobin 15.1, hematocrit 44.5, platelet count 164,000.  TSH  was 0.896.   HOSPITAL COURSE:  The patient was admitted to telemetry.  He was initiated  on Tikosyn at 500 mcg b.i.d..  He tolerated this medication very well.  His  QTC at the time of discharge was 465 milliseconds.  He had a marked  reduction in his ectopic atrial tachycardia.  Over the 48 hours after  initiation of his medication, he had only one prolonged episode that lasted  approximately 45 minutes to an hour at a rate of 106 beats per minute.  He  did have one episode of six beats of nonsustained ventricular tachycardia.  This was asymptomatic.  He had no acute QT prolongation.  We did reduce his  beta blocker therapy from 75 of Toprol day to 25 mg per day.  He felt much  more energetic here in the hospital and it was felt that he was stable for  discharge.  He was discharged home in stable condition on 07/13/2005.   DISCHARGE DIAGNOSES:  1.  Recurrent ectopic atrial tachycardia.  2.  Tachycardia-mediated cardiomyopathy.  3.  Bicuspid aortic valve with chronic mild to moderate aortic      insufficiency.  4.  Hypertension.  5.  Status post left carotid endarterectomy.   DISCHARGE MEDICATIONS:  1.  Coated aspirin 81 mg per day.  2.  Toprol XL 25 mg per day.  3.  Accupril 10 mg per day.  4.  Ultram 50 mg t.i.d. p.r.n.  5.  Tikosyn 500 mg b.i.d.   He  will follow up with Dr. Swaziland in 7-10 days and will have ECG repeated at  that time.           ______________________________  Peter M. Swaziland, M.D.     PMJ/MEDQ  D:  07/13/2005  T:  07/14/2005  Job:  [id number]   cc:   Arville Care, MD  [place], [place]   Quita Skye. Hart Rochester, M.D.  [address]  [place]  [place] [address]

## 2010-08-16 ENCOUNTER — Other Ambulatory Visit: Payer: Self-pay | Admitting: *Deleted

## 2010-08-16 MED ORDER — PROPAFENONE HCL ER 325 MG PO CP12: 325.0000 mg | ORAL_CAPSULE | Freq: Two times a day (BID) | ORAL | Status: DC

## 2010-08-16 NOTE — Telephone Encounter (Signed)
Called requesting refill on Rythmol. Sent 90 day supply to [facility] [address]

## 2010-11-21 ENCOUNTER — Telehealth: Payer: Self-pay | Admitting: Cardiology

## 2010-11-21 NOTE — Telephone Encounter (Signed)
Pt wife calling wanting to know if Synetta Fail would like and/or need some sweet potatoes. Please return pt call to discuss further

## 2010-11-22 NOTE — Telephone Encounter (Signed)
lm

## 2010-11-22 NOTE — Telephone Encounter (Signed)
Wanted to confirm app next week. She also wanted to bring Dr. Swaziland and I some vegetables.

## 2010-11-23 ENCOUNTER — Encounter: Payer: Self-pay | Admitting: *Deleted

## 2010-12-02 ENCOUNTER — Encounter: Payer: Self-pay | Admitting: Cardiology

## 2010-12-02 ENCOUNTER — Ambulatory Visit (INDEPENDENT_AMBULATORY_CARE_PROVIDER_SITE_OTHER): Payer: Medicare Other | Admitting: Cardiology

## 2010-12-02 DIAGNOSIS — I498 Other specified cardiac arrhythmias: Secondary | ICD-10-CM

## 2010-12-02 DIAGNOSIS — I10 Essential (primary) hypertension: Secondary | ICD-10-CM | POA: Insufficient documentation

## 2010-12-02 DIAGNOSIS — Q231 Congenital insufficiency of aortic valve: Secondary | ICD-10-CM

## 2010-12-02 DIAGNOSIS — I471 Supraventricular tachycardia: Secondary | ICD-10-CM

## 2010-12-02 DIAGNOSIS — I428 Other cardiomyopathies: Secondary | ICD-10-CM | POA: Insufficient documentation

## 2010-12-02 NOTE — Assessment & Plan Note (Signed)
Mild aortic stenosis by echocardiogram. Currently no symptoms. We'll continue to monitor.

## 2010-12-02 NOTE — Progress Notes (Signed)
Tanner Williamson Date of Birth: [birth year]   History of Present Illness: Tanner Williamson is seen today for yearly followup. He has a long-standing history of an ectopic atrial tachycardia. This has been difficult to manage. He had a radiofrequency catheter ablation in 2006 by Dr.Klein. This resulted in transient complete heart block because of proximity of the focus to the AV node. He then underwent cryoablation in February 2007 at [facility]. This was unsuccessful and he he has been managed with long-term antiarrhythmic drug therapy with Rythmol. He also has a history of tachycardia mediated cardiomyopathy. His most recent ejection fraction was 50-55% by echocardiogram one year ago. He has a history of a bicuspid aortic valve with only mild stenosis. Phone call today he feels well. He is working very hard. He denies any significant palpitations but states that his heart seems to go out of rhythm every once in a while. He's had no dizziness or syncope. He denies any shortness of breath or chest pain.  Current Outpatient Prescriptions on File Prior to Visit  Medication Sig Dispense Refill  . aspirin 81 MG tablet Take 81 mg by mouth daily.        . digoxin (LANOXIN) 0.125 MG tablet Take 125 mcg by mouth daily.        . DULoxetine (CYMBALTA) 60 MG capsule Take 60 mg by mouth daily.        Marland Kitchen glimepiride (AMARYL) 2 MG tablet Take 2 mg by mouth daily before breakfast.        . HYDROCODONE-ACETAMINOPHEN PO Take 600 mg by mouth as needed.        . naproxen sodium (ANAPROX) 220 MG tablet Take 220 mg by mouth as needed.        . propafenone (RYTHMOL SR) 325 MG 12 hr capsule Take 1 capsule (325 mg total) by mouth 2 (two) times daily.  180 capsule  3  . quinapril (ACCUPRIL) 10 MG tablet Take 10 mg by mouth at bedtime.          Allergies  Allergen Reactions  . Codeine     Past Medical History  Diagnosis Date  . Hypertension   . Ectopic atrial tachycardia   . Bicuspid aortic valve   . Diabetes  mellitus     type 2  . Cardiomyopathy     Past Surgical History  Procedure Date  . Cardiac catheterization 09/07/2004    Est. EF at 40% --  No significant obstructive coronary disease  -- Moderate left ventricular dysfunction  . Cholecystectomy   . Carotid endarterectomy      Left carotid endarterectomy with Dacron patch angioplasty.  . Insert / replace / remove pacemaker 03/03/2005    Temporary permanent transvenous pacemaker insertion   . Tee with cardioversion 03/03/2005    confirmed an atrial tachycardia that was originating from the septum somewhere just inferior to the His and superior to the coronary sinus os  . Skin cancer removal     nose    History  Smoking status  . Former Smoker -- 2.0 packs/day for 42 years  . Types: Cigarettes  . Quit date: 05/08/2005  Smokeless tobacco  . Not on file    History  Alcohol Use No    Family History  Problem Relation Age of Onset  . Parkinsonism Mother   . Alzheimer's disease Mother     Review of Systems: As noted in history of present illness.  All other systems were reviewed and are negative.  Physical Exam: BP 134/72  Pulse 64  Ht 5\' 9"  (1.753 m)  Wt 208 lb (94.348 kg)  BMI 30.72 kg/m2 The patient is alert and oriented x 3.  The mood and affect are normal.  The skin is warm and dry.  Color is normal.  The HEENT exam reveals that the sclera are nonicteric.  The mucous membranes are moist.  The carotids are 2+ without bruits.  There is no thyromegaly.  There is no JVD.  The lungs are clear.  The chest wall is non tender.  The heart exam reveals a regular rate with a normal S1 and S2.  There is a soft grade 1/6 systolic murmur at the right upper sternal border.   The PMI is not displaced.   Abdominal exam reveals good bowel sounds.  There is no guarding or rebound.  There is no hepatosplenomegaly or tenderness.  There are no masses.  Exam of the legs reveal no clubbing, cyanosis, or edema.  The legs are without rashes.  The  distal pulses are intact.  Cranial nerves II - XII are intact.  Motor and sensory functions are intact.  The gait is normal.  LABORATORY DATA:   Assessment / Plan:

## 2010-12-02 NOTE — Patient Instructions (Signed)
Continue your current medications.  I will see you again in 1 year.   

## 2010-12-02 NOTE — Assessment & Plan Note (Signed)
History of tachycardia mediated cardiomyopathy. Ejection fraction has improved with good rhythm control.

## 2010-12-02 NOTE — Assessment & Plan Note (Signed)
Status post 2 ablative procedures  without success. Fairly good control on long-term Rythmol therapy.

## 2011-04-12 ENCOUNTER — Other Ambulatory Visit: Payer: Self-pay | Admitting: Cardiology

## 2011-04-12 MED ORDER — QUINAPRIL HCL 10 MG PO TABS: 10.0000 mg | ORAL_TABLET | Freq: Every day | ORAL | Status: DC

## 2011-04-12 MED ORDER — DIGOXIN 125 MCG PO TABS: 125.0000 ug | ORAL_TABLET | Freq: Every day | ORAL | Status: DC

## 2011-04-12 MED ORDER — PROPAFENONE HCL ER 325 MG PO CP12: 325.0000 mg | ORAL_CAPSULE | Freq: Two times a day (BID) | ORAL | Status: DC

## 2011-04-12 NOTE — Telephone Encounter (Signed)
New problem Pt wants 90 day supply

## 2011-07-10 ENCOUNTER — Telehealth: Payer: Self-pay | Admitting: Cardiology

## 2011-07-10 NOTE — Telephone Encounter (Signed)
Patient's wife called, stating patient saw PCP Dr.Hans White last week and was prescribed Lisinopril.States patient already taking quinapril.Wife was told need to call PCP and inform him patient already taking quinapril.Dr.Hans White's office called spoke to Silvio Pate was told received phone call from wife,was told patient already taking quinapril.Silvio Pate stated she will let Dr.White know.Wife also stated in confidence she was concerned about husband taking too much hydrocodone.Wife was told she will have to talk to husband and they need to sit down and discuss this problem with PCP.

## 2011-07-10 NOTE — Telephone Encounter (Signed)
Please return call to patient wife sheila at [phone]   Patient wife is concerned as PCP gave patient yet another BP pill which concerns her.  Please return call to Velna Hatchet at [phone] to discuss medications.

## 2011-08-04 ENCOUNTER — Ambulatory Visit: Payer: BC Managed Care – PPO | Admitting: Nurse Practitioner

## 2011-08-14 ENCOUNTER — Ambulatory Visit (HOSPITAL_COMMUNITY): Payer: BC Managed Care – PPO | Attending: Cardiology | Admitting: Radiology

## 2011-08-14 ENCOUNTER — Encounter: Payer: Self-pay | Admitting: Physician Assistant

## 2011-08-14 ENCOUNTER — Ambulatory Visit (INDEPENDENT_AMBULATORY_CARE_PROVIDER_SITE_OTHER): Payer: Medicare Other | Admitting: Physician Assistant

## 2011-08-14 VITALS — BP 121/61 | HR 67 | Ht 68.0 in | Wt 206.0 lb

## 2011-08-14 DIAGNOSIS — F17200 Nicotine dependence, unspecified, uncomplicated: Secondary | ICD-10-CM | POA: Insufficient documentation

## 2011-08-14 DIAGNOSIS — I10 Essential (primary) hypertension: Secondary | ICD-10-CM | POA: Insufficient documentation

## 2011-08-14 DIAGNOSIS — I359 Nonrheumatic aortic valve disorder, unspecified: Secondary | ICD-10-CM

## 2011-08-14 DIAGNOSIS — I498 Other specified cardiac arrhythmias: Secondary | ICD-10-CM | POA: Insufficient documentation

## 2011-08-14 DIAGNOSIS — I428 Other cardiomyopathies: Secondary | ICD-10-CM

## 2011-08-14 DIAGNOSIS — I471 Supraventricular tachycardia: Secondary | ICD-10-CM

## 2011-08-14 DIAGNOSIS — Z01810 Encounter for preprocedural cardiovascular examination: Secondary | ICD-10-CM

## 2011-08-14 DIAGNOSIS — E119 Type 2 diabetes mellitus without complications: Secondary | ICD-10-CM | POA: Insufficient documentation

## 2011-08-14 DIAGNOSIS — I429 Cardiomyopathy, unspecified: Secondary | ICD-10-CM

## 2011-08-14 DIAGNOSIS — I517 Cardiomegaly: Secondary | ICD-10-CM | POA: Insufficient documentation

## 2011-08-14 DIAGNOSIS — I350 Nonrheumatic aortic (valve) stenosis: Secondary | ICD-10-CM

## 2011-08-14 NOTE — Patient Instructions (Addendum)
ECHO TODAY OK PER FELICIA DX V72.81 AND ECTOPIC ATRIAL TACHYCARDIA  FOLLOW UP WITH DR. Swaziland AS DIRECTED

## 2011-08-14 NOTE — Progress Notes (Signed)
[address] [place], [place]  [address] Phone: [phone] Fax:  [phone]  Date:  08/14/2011   Name:  Tanner Williamson   DOB:  27-May-1945   MRN:  [id number]  PCP:  Jacques Navy, PA  Primary Cardiologist:  Dr. Peter Swaziland  Primary Electrophysiologist:  Dr. Sherryl Manges    History of Present Illness: Tanner Williamson is a 66 y.o. male who returns for surgical clearance.  He is seen for Dr.  Shawnie Pons.  He has a long-standing history of ectopic atrial tachycardia that has been difficult to manage.  He had RFCA done in 2006 by Dr. Graciela Husbands that resulted in transient complete heart block.  He then underwent cryoablation 2/07 at [facility] but was unsuccessful.  He has been managed with Rythmol.  He has a history of tachycardia mediated cardiomyopathy.  Most recent ejection fraction 50-55% by echocardiogram in 2011.  Echo 11/2009: Mild LVH, bicuspid aortic valve with mild aortic stenosis and mild AI, EF 50-55%.  LHC 12/06: Proximal LAD 20%, minor irregularities throughout a 10%, circumflex normal, RCA 10-20% irregularities, EF 40%.  Last seen by Dr. Swaziland 11/2010.  He was stable with plans for one-year followup.  He has recently been seen by his gastroenterologist.  He needs clearance prior to screening colonoscopy.  He is doing well.  Denies chest pain or shortness of breath.  Denies syncope.  Denies palpitations.  Denies Orthopnea, PND or edema.  He is able to achieve 4 METs without significant problems.    Wt Readings from Last 3 Encounters:  08/14/11 206 lb (93.441 kg)  12/02/10 208 lb (94.348 kg)  11/23/09 213 lb (96.616 kg)     Past Medical History  Diagnosis Date  . Hypertension   . Ectopic atrial tachycardia   . Bicuspid aortic valve   . Diabetes mellitus     type 2  . Cardiomyopathy     Current Outpatient Prescriptions  Medication Sig Dispense Refill  . alprazolam (XANAX) 2 MG tablet Ad lib.      Marland Kitchen aspirin 81 MG tablet Take 81 mg by mouth daily.         . digoxin (LANOXIN) 0.125 MG tablet Take 1 tablet (125 mcg total) by mouth daily.  30 tablet  5  . DULoxetine (CYMBALTA) 60 MG capsule Take 60 mg by mouth daily.        Marland Kitchen glimepiride (AMARYL) 2 MG tablet Take 2 mg by mouth daily before breakfast.        . HYDROCODONE-ACETAMINOPHEN PO Take 600 mg by mouth as needed.        . naproxen sodium (ANAPROX) 220 MG tablet Take 220 mg by mouth as needed.        . propafenone (RYTHMOL SR) 325 MG 12 hr capsule Take 1 capsule (325 mg total) by mouth 2 (two) times daily.  180 capsule  3  . quinapril (ACCUPRIL) 10 MG tablet Take 1 tablet (10 mg total) by mouth at bedtime.  30 tablet  5    Allergies: Allergies  Allergen Reactions  . Codeine     History  Substance Use Topics  . Smoking status: Former Smoker -- 2.0 packs/day for 42 years    Types: Cigarettes    Quit date: 05/08/2005  . Smokeless tobacco: Never Used  . Alcohol Use: No     ROS:  Please see the history of present illness.   Occasional BRBPR.  All other systems reviewed and negative.   PHYSICAL  EXAM: VS:  BP 121/61  Pulse 67  Ht 5\' 8"  (1.727 m)  Wt 206 lb (93.441 kg)  BMI 31.32 kg/m2 Well nourished, well developed, in no acute distress HEENT: normal Neck: no JVD Cardiac:  normal S1, S2; RRR; 1/6 systolic murmur at RUSB and 1/6 diast murmur along LLSB Lungs:  clear to auscultation bilaterally, no wheezing, rhonchi or rales Abd: soft, nontender, no hepatomegaly Ext: no edema Skin: warm and dry Neuro:  CNs 2-12 intact, no focal abnormalities noted  EKG:  Sinus rhythm, heart rate 64, normal axis, nonspecific ST-T wave changes, QTc 429 ms   ASSESSMENT AND PLAN:  1.  Ectopic Atrial Tachycardia Maintaining sinus rhythm on Rythmol. He should continue this medicine throughout his upcoming procedure.  2.  Bicuspid Aortic Valve with Mild Aortic Stenosis He denies any symptoms consistent with angina.  He had minimal coronary plaque at cardiac catheterization in 2006.  It  has been almost 2 years since his last echocardiogram.  I recommend proceeding with a follow up echocardiogram prior to clearing him for his procedure.  If this is stable, he can proceed without further workup.  Otherwise, he will see Dr. Swaziland as previously planned in September 2013.  3.  Tachycardia Mediated Cardiomyopathy EF has recovered in the past. No signs of volume overload. Repeat echo pending as noted above.   Signed, Tereso Newcomer, PA-C  3:16 PM 08/14/2011

## 2011-08-14 NOTE — Progress Notes (Signed)
Echocardiogram performed.  

## 2011-08-16 ENCOUNTER — Telehealth: Payer: Self-pay | Admitting: Cardiology

## 2011-08-16 ENCOUNTER — Encounter: Payer: Self-pay | Admitting: *Deleted

## 2011-08-16 MED ORDER — QUINAPRIL HCL 20 MG PO TABS: 20.0000 mg | ORAL_TABLET | Freq: Every day | ORAL | Status: DC

## 2011-08-16 NOTE — Telephone Encounter (Signed)
Message copied by Antony Odea on Wed Aug 16, 2011  5:35 PM ------      Message from: Swaziland, PETER M      Created: Tue Aug 15, 2011  5:47 PM       Bicuspid aortic valve with mild stenosis- unchanged from before. EF 40% compared to 50% previously. Mild aortic root enlargement.      Recommend increasing accupril to 20 mg daily.      He is cleared for colonoscopy.      Peter Swaziland MD, [facility]

## 2011-08-16 NOTE — Telephone Encounter (Signed)
New problem:  Per patient wife calling regarding, wife is aware that Dr. Swaziland nurse is on vacation.   1. test results - echo.   2. Advise on which medication he should stop taken due to upcoming colonoscopy.

## 2011-08-16 NOTE — Telephone Encounter (Signed)
Patient called with results. Med increase explained/ new order sent. Pt verbalized understanding. Will write clearance letter and send to Dr Lynann Bologna, fax [phone],  Alfonso Ramus RN

## 2011-08-16 NOTE — Telephone Encounter (Signed)
LMTCB--nt 

## 2011-09-19 ENCOUNTER — Other Ambulatory Visit: Payer: Self-pay | Admitting: *Deleted

## 2011-09-19 ENCOUNTER — Other Ambulatory Visit: Payer: Self-pay | Admitting: Cardiology

## 2011-09-19 MED ORDER — PROPAFENONE HCL ER 325 MG PO CP12: 325.0000 mg | ORAL_CAPSULE | Freq: Two times a day (BID) | ORAL | Status: DC

## 2011-09-19 MED ORDER — QUINAPRIL HCL 20 MG PO TABS: 20.0000 mg | ORAL_TABLET | Freq: Every day | ORAL | Status: DC

## 2011-10-19 ENCOUNTER — Telehealth: Payer: Self-pay | Admitting: Cardiology

## 2011-10-19 NOTE — Telephone Encounter (Signed)
error 

## 2011-10-26 ENCOUNTER — Telehealth: Payer: Self-pay

## 2011-10-26 NOTE — Telephone Encounter (Signed)
Received fax request from [facility] requesting last 3 office visits

## 2011-10-26 NOTE — Telephone Encounter (Signed)
Received fax request from [facility] requesting patient's last 3 office visits.[facility] called was told will need patient's signature before we can fax records.Spoke to patient's wife she stated patient will go by [employer] 10/27/11 and sign a release for [place] to fax his records and sign a release for [facility] to sent [place] their records.

## 2011-11-01 ENCOUNTER — Other Ambulatory Visit: Payer: Self-pay | Admitting: Cardiology

## 2011-11-01 NOTE — Telephone Encounter (Signed)
Fax Received. Refill Completed. Tanner Williamson (R.M.A)   

## 2011-11-28 ENCOUNTER — Ambulatory Visit: Payer: BC Managed Care – PPO | Admitting: Cardiology

## 2012-01-10 ENCOUNTER — Encounter: Payer: Self-pay | Admitting: Cardiology

## 2012-01-10 ENCOUNTER — Ambulatory Visit (INDEPENDENT_AMBULATORY_CARE_PROVIDER_SITE_OTHER): Payer: BC Managed Care – PPO | Admitting: Cardiology

## 2012-01-10 VITALS — BP 112/58 | HR 75 | Ht 68.0 in | Wt 195.0 lb

## 2012-01-10 DIAGNOSIS — I498 Other specified cardiac arrhythmias: Secondary | ICD-10-CM

## 2012-01-10 DIAGNOSIS — Q2381 Bicuspid aortic valve: Secondary | ICD-10-CM

## 2012-01-10 DIAGNOSIS — I471 Supraventricular tachycardia: Secondary | ICD-10-CM

## 2012-01-10 DIAGNOSIS — I428 Other cardiomyopathies: Secondary | ICD-10-CM

## 2012-01-10 DIAGNOSIS — Q231 Congenital insufficiency of aortic valve: Secondary | ICD-10-CM

## 2012-01-10 DIAGNOSIS — I10 Essential (primary) hypertension: Secondary | ICD-10-CM

## 2012-01-10 DIAGNOSIS — I509 Heart failure, unspecified: Secondary | ICD-10-CM

## 2012-01-10 DIAGNOSIS — I4719 Other supraventricular tachycardia: Secondary | ICD-10-CM

## 2012-01-10 DIAGNOSIS — R5383 Other fatigue: Secondary | ICD-10-CM

## 2012-01-10 DIAGNOSIS — I5022 Chronic systolic (congestive) heart failure: Secondary | ICD-10-CM

## 2012-01-10 DIAGNOSIS — R5381 Other malaise: Secondary | ICD-10-CM

## 2012-01-10 MED ORDER — DIGOXIN 125 MCG PO TABS: 0.1250 mg | ORAL_TABLET | Freq: Every day | ORAL | Status: DC

## 2012-01-10 MED ORDER — CARVEDILOL 6.25 MG PO TABS: 6.2500 mg | ORAL_TABLET | Freq: Two times a day (BID) | ORAL | Status: DC

## 2012-01-10 NOTE — Progress Notes (Signed)
Tanner Williamson Date of Birth: 05/09/1945   History of Present Illness: Tanner Williamson is seen today for yearly followup. He has a long-standing history of an ectopic atrial tachycardia. This has been difficult to manage. He had a radiofrequency catheter ablation in 2006 by Dr.Klein. This resulted in transient complete heart block because of proximity of the focus to the AV node. He then underwent cryoablation in February 2007 at [facility]. This was unsuccessful and he he has been managed with long-term antiarrhythmic drug therapy with Rythmol. He also has a history of tachycardia mediated cardiomyopathy. His echocardiogram in 2011 showed an ejection fraction of 50-55%.  He has a history of a bicuspid aortic valve with only mild stenosis. Repeat echocardiogram in June of this year showed global hypokinesis with ejection fraction of 40%. His Accupril dose was increased. He denies any significant shortness of breath or chest pain. He does feel his heart skipping at times but denies any racing.  Current Outpatient Prescriptions on File Prior to Visit  Medication Sig Dispense Refill  . alprazolam (XANAX) 2 MG tablet Ad lib.      Marland Kitchen aspirin 81 MG tablet Take 81 mg by mouth daily.        . DULoxetine (CYMBALTA) 60 MG capsule Take 30 mg by mouth daily.       Marland Kitchen glimepiride (AMARYL) 2 MG tablet Take 2 mg by mouth daily before breakfast.        . naproxen sodium (ANAPROX) 220 MG tablet Take 220 mg by mouth as needed.        . propafenone (RYTHMOL SR) 325 MG 12 hr capsule Take 1 capsule (325 mg total) by mouth 2 (two) times daily.  180 capsule  3  . quinapril (ACCUPRIL) 20 MG tablet Take 1 tablet (20 mg total) by mouth at bedtime.  90 tablet  1  . [DISCONTINUED] digoxin (LANOXIN) 0.125 MG tablet TAKE 1 TABLET BY MOUTH DAILY  30 tablet  5  . carvedilol (COREG) 6.25 MG tablet Take 1 tablet (6.25 mg total) by mouth 2 (two) times daily with a meal.  180 tablet  3    Allergies  Allergen Reactions  . Codeine      Past Medical History  Diagnosis Date  . Hypertension   . Ectopic atrial tachycardia   . Bicuspid aortic valve   . Diabetes mellitus     type 2  . Cardiomyopathy     nonischemic    Past Surgical History  Procedure Date  . Cardiac catheterization 09/07/2004    Est. EF at 40% --  No significant obstructive coronary disease  -- Moderate left ventricular dysfunction  . Cholecystectomy   . Carotid endarterectomy      Left carotid endarterectomy with Dacron patch angioplasty.  . Insert / replace / remove pacemaker 03/03/2005    Temporary permanent transvenous pacemaker insertion   . Tee with cardioversion 03/03/2005    confirmed an atrial tachycardia that was originating from the septum somewhere just inferior to the His and superior to the coronary sinus os  . Skin cancer removal     nose    History  Smoking status  . Current Some Day Smoker -- 2.0 packs/day for 42 years  . Types: Cigarettes  . Last Attempt to Quit: 05/08/2005  Smokeless tobacco  . Never Used    History  Alcohol Use No    Family History  Problem Relation Age of Onset  . Parkinsonism Mother   . Alzheimer's disease  Mother     Review of Systems: Review of systems is positive for fatigue and hypersomnolence. He states he goes to sleep very easily if he just sits in a recliner. He is tired in the morning. He has even been using energy drinks in the morning. His wife does report that he snores a lot and actually sleeps in a separate bedroom. He did undergo colonoscopy recently which findings of 2 benign polyps. All other systems were reviewed and are negative.  Physical Exam: BP 112/58  Pulse 75  Ht 5\' 8"  (1.727 m)  Wt 88.451 kg (195 lb)  BMI 29.65 kg/m2  SpO2 96% The patient is alert and oriented x 3.  The mood and affect are normal.  The skin is warm and dry.  Color is normal.  The HEENT exam reveals that the sclera are nonicteric.  The mucous membranes are moist.  The carotids are 2+ without  bruits.  There is no thyromegaly.  There is no JVD.  The lungs are clear.  The chest wall is non tender.  The heart exam reveals a regular rate with a normal S1 and S2.  There is a soft grade 1/6 systolic murmur at the right upper sternal border.   The PMI is not displaced.   Abdominal exam reveals good bowel sounds.  There is no guarding or rebound.  There is no hepatosplenomegaly or tenderness.  There are no masses.  Exam of the legs reveal no clubbing, cyanosis, or edema.  The legs are without rashes.  The distal pulses are intact.  Cranial nerves II - XII are intact.  Motor and sensory functions are intact.  The gait is normal.  LABORATORY DATA:   Assessment / Plan: 1. 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 He has no overt evidence of CHF. Ejection fraction decreased to 40%. He is on Accupril and digoxin. We will add carvedilol 6.25 mg twice a day. I'll followup again in 2 months. We will try to optimize his medications and then repeat an echocardiogram in a few months.  2. Ectopic atrial tachycardia. This appears to be well-controlled on Rythmol. If he has further decline in his ejection fraction this may be an issue but we will see how he responds to optimization of his medication.  3. Bicuspid aortic valve with mild stenosis.  4. Fatigue, hypersomnolence, and snoring. We will arrange for a sleep study to rule out sleep apnea.  5. Hypertension, controlled.

## 2012-01-10 NOTE — Patient Instructions (Signed)
We will add carvedilol 6.25 mg twice a day for your weak heart  Continue your other medication.  We will schedule you for a sleep study.  I will see you back in 2 months.

## 2012-02-23 ENCOUNTER — Ambulatory Visit: Payer: BC Managed Care – PPO | Admitting: Cardiology

## 2012-03-13 ENCOUNTER — Encounter: Payer: Self-pay | Admitting: Cardiology

## 2012-03-20 ENCOUNTER — Other Ambulatory Visit: Payer: Self-pay | Admitting: *Deleted

## 2012-03-20 MED ORDER — QUINAPRIL HCL 20 MG PO TABS: 20.0000 mg | ORAL_TABLET | Freq: Every day | ORAL | Status: DC

## 2012-10-28 ENCOUNTER — Other Ambulatory Visit: Payer: Self-pay | Admitting: Cardiology

## 2012-12-26 ENCOUNTER — Ambulatory Visit: Payer: BC Managed Care – PPO | Admitting: Cardiology

## 2013-01-01 ENCOUNTER — Ambulatory Visit: Payer: BC Managed Care – PPO | Admitting: Physician Assistant

## 2013-03-03 ENCOUNTER — Encounter: Payer: Self-pay | Admitting: Cardiology

## 2013-03-10 ENCOUNTER — Ambulatory Visit: Payer: BC Managed Care – PPO | Admitting: Cardiology

## 2013-03-28 ENCOUNTER — Encounter: Payer: Self-pay | Admitting: Cardiology

## 2013-04-14 ENCOUNTER — Encounter (INDEPENDENT_AMBULATORY_CARE_PROVIDER_SITE_OTHER): Payer: Self-pay

## 2013-04-14 ENCOUNTER — Ambulatory Visit (INDEPENDENT_AMBULATORY_CARE_PROVIDER_SITE_OTHER): Payer: Medicare Other | Admitting: Cardiology

## 2013-04-14 ENCOUNTER — Encounter: Payer: Self-pay | Admitting: Cardiology

## 2013-04-14 VITALS — BP 140/58 | HR 60 | Ht 68.0 in | Wt 198.4 lb

## 2013-04-14 DIAGNOSIS — Q2381 Bicuspid aortic valve: Secondary | ICD-10-CM

## 2013-04-14 DIAGNOSIS — I509 Heart failure, unspecified: Secondary | ICD-10-CM

## 2013-04-14 DIAGNOSIS — I4719 Other supraventricular tachycardia: Secondary | ICD-10-CM

## 2013-04-14 DIAGNOSIS — I10 Essential (primary) hypertension: Secondary | ICD-10-CM

## 2013-04-14 DIAGNOSIS — I498 Other specified cardiac arrhythmias: Secondary | ICD-10-CM

## 2013-04-14 DIAGNOSIS — Q231 Congenital insufficiency of aortic valve: Secondary | ICD-10-CM

## 2013-04-14 DIAGNOSIS — I5022 Chronic systolic (congestive) heart failure: Secondary | ICD-10-CM

## 2013-04-14 DIAGNOSIS — I428 Other cardiomyopathies: Secondary | ICD-10-CM

## 2013-04-14 DIAGNOSIS — I471 Supraventricular tachycardia: Secondary | ICD-10-CM

## 2013-04-14 MED ORDER — CARVEDILOL 6.25 MG PO TABS: 6.2500 mg | ORAL_TABLET | Freq: Two times a day (BID) | ORAL | Status: DC

## 2013-04-14 MED ORDER — DIGOXIN 125 MCG PO TABS: 0.1250 mg | ORAL_TABLET | Freq: Every day | ORAL | Status: DC

## 2013-04-14 MED ORDER — PROPAFENONE HCL ER 325 MG PO CP12: 325.0000 mg | ORAL_CAPSULE | Freq: Two times a day (BID) | ORAL | Status: DC

## 2013-04-14 MED ORDER — QUINAPRIL HCL 20 MG PO TABS: 20.0000 mg | ORAL_TABLET | Freq: Every day | ORAL | Status: DC

## 2013-04-14 NOTE — Progress Notes (Signed)
Tanner Williamson Date of Birth: 1945-05-27   History of Present Illness: Tanner Williamson is seen today for yearly followup. He has a long-standing history of an ectopic atrial tachycardia. This has been difficult to manage. He had a radiofrequency catheter ablation in 2006 by Dr.Klein. This resulted in transient complete heart block because of proximity of the focus to the AV node. He then underwent cryoablation in February 2007 at [facility]. This was unsuccessful and he he has been managed with long-term antiarrhythmic drug therapy with Rythmol. He also has a history of tachycardia mediated cardiomyopathy. His echocardiogram in 2011 showed an ejection fraction of 50-55%.  He has a history of a bicuspid aortic valve with only mild stenosis. Repeat echocardiogram in June of 2013 showed global hypokinesis with ejection fraction of 40%. His Accupril dose was increased and he was started on carvedilol. He denies any significant shortness of breath or chest pain. He has no palpitations or tachycardia. He has a bad back and L3-5 surgery is recommended.  Current Outpatient Prescriptions on File Prior to Visit  Medication Sig Dispense Refill  . alprazolam (XANAX) 2 MG tablet Take 1 mg by mouth 2 (two) times daily.       . citalopram (CELEXA) 10 MG tablet Take 10 mg by mouth daily.      . ergocalciferol (VITAMIN D2) 50000 UNITS capsule Take 50,000 Units by mouth once a week.      . gabapentin (NEURONTIN) 800 MG tablet Take 800 mg by mouth 2 (two) times daily.      . hydrocortisone (ANUSOL-HC) 25 MG suppository Place 25 mg rectally 2 (two) times daily.      . metFORMIN (GLUCOPHAGE) 500 MG tablet Take 500 mg by mouth 2 (two) times daily with a meal.      . simvastatin (ZOCOR) 20 MG tablet Take 20 mg by mouth daily.      . traMADol (ULTRAM) 50 MG tablet Take by mouth every 6 (six) hours.       No current facility-administered medications on file prior to visit.    Allergies  Allergen Reactions  .  Codeine Nausea Only and Other (See Comments)    Restlessness & headaches  . Glimepiride Diarrhea    Past Medical History  Diagnosis Date  . Bicuspid aortic valve   . Cardiomyopathy     nonischemic  . Hemorrhoids   . Diabetes mellitus     type 2, uncontrolled  . Vitamin D deficiency   . Hyperlipidemia   . Dysthymia   . Benign essential hypertension   . Ectopic atrial tachycardia   . Atrial fibrillation   . Conduction disorder of the heart   . Degeneration of lumbosacral intervertebral disc   . Spinal stenosis of lumbar region   . Chronic pain syndrome   . Hyperglycemia   . Arthralgia   . Back pain   . Squamous cell cancer of skin of nose     removed  . Polyp of colon     by colonoscopy  . GERD (gastroesophageal reflux disease)   . Sleep apnea   . Anxiety disorder   . Major depressive disorder     Past Surgical History  Procedure Laterality Date  . Cardiac catheterization  09/07/2004    Est. EF at 40% --  No significant obstructive coronary disease  -- Moderate left ventricular dysfunction  . Cholecystectomy  1998  . Carotid endarterectomy       Left carotid endarterectomy with Dacron patch angioplasty.  Marland Kitchen  Insert / replace / remove pacemaker  03/03/2005    Temporary permanent transvenous pacemaker insertion   . Tee with cardioversion  03/03/2005    confirmed an atrial tachycardia that was originating from the septum somewhere just inferior to the His and superior to the coronary sinus os  . Skin cancer removal      nose  . Carotid endarterectomy  2003    History  Smoking status  . Current Some Day Smoker -- 2.00 packs/day for 49 years  . Types: Cigarettes  Smokeless tobacco  . Never Used    History  Alcohol Use No    Family History  Problem Relation Age of Onset  . Parkinsonism Mother   . Alzheimer's disease Mother   . Heart attack Father   .       Review of Systems: Review of systems is as noted in HPI. He quit drinking energy drinks and feels  better. All other systems were reviewed and are negative.  Physical Exam: BP 140/58  Pulse 60  Ht 5\' 8"  (1.727 m)  Wt 198 lb 6.4 oz (89.994 kg)  BMI 30.17 kg/m2 WDWM in NAD. The HEENT exam is normal.  The carotids are 2+ without bruits.  There is no thyromegaly.  There is no JVD.  The lungs are clear.    The heart exam reveals a regular rate with a normal S1 and S2.  There is a soft grade 1/6 systolic murmur at the right upper sternal border.   The PMI is not displaced.   Abdominal exam reveals good bowel sounds.  There is no guarding or rebound.  There is no hepatosplenomegaly or tenderness.  There are no masses.  Exam of the legs reveal no clubbing, cyanosis, or edema.  The legs are without rashes.  The distal pulses are intact.  Cranial nerves II - XII are intact.  Motor and sensory functions are intact.  The gait is normal.  LABORATORY DATA: Ecg: NSR 1st degree AV block. LVH.  Assessment / Plan: 1. 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 He has no overt evidence of CHF. Ejection fraction last 40%. He is on Accupril, coreg,  and digoxin.  I'll followup again in 2 months. We repeat Echo.  2. Ectopic atrial tachycardia. This appears to be well-controlled on Rythmol. If he has further decline in his ejection fraction this may be an issue but we will see how he responds to optimization of his medication.  3. Bicuspid aortic valve with mild stenosis.  4. Back pain. He is clear for surgery from our standpoint.  5. Hypertension, controlled.

## 2013-04-14 NOTE — Patient Instructions (Signed)
Continue your current therapy  We will get an Echocardiogram  I will see you in one year.

## 2013-04-24 ENCOUNTER — Inpatient Hospital Stay (HOSPITAL_COMMUNITY): Admission: RE | Admit: 2013-04-24 | Payer: No Typology Code available for payment source | Source: Ambulatory Visit

## 2013-05-19 ENCOUNTER — Ambulatory Visit (HOSPITAL_COMMUNITY): Payer: Medicare Other | Attending: Internal Medicine | Admitting: Cardiology

## 2013-05-19 ENCOUNTER — Encounter: Payer: Self-pay | Admitting: Internal Medicine

## 2013-05-19 ENCOUNTER — Telehealth: Payer: Self-pay | Admitting: Nurse Practitioner

## 2013-05-19 DIAGNOSIS — I5022 Chronic systolic (congestive) heart failure: Secondary | ICD-10-CM

## 2013-05-19 DIAGNOSIS — R079 Chest pain, unspecified: Secondary | ICD-10-CM

## 2013-05-19 DIAGNOSIS — I4719 Other supraventricular tachycardia: Secondary | ICD-10-CM

## 2013-05-19 DIAGNOSIS — I471 Supraventricular tachycardia: Secondary | ICD-10-CM

## 2013-05-19 DIAGNOSIS — Q231 Congenital insufficiency of aortic valve: Secondary | ICD-10-CM

## 2013-05-19 DIAGNOSIS — I498 Other specified cardiac arrhythmias: Secondary | ICD-10-CM | POA: Insufficient documentation

## 2013-05-19 DIAGNOSIS — I10 Essential (primary) hypertension: Secondary | ICD-10-CM

## 2013-05-19 DIAGNOSIS — Q2381 Bicuspid aortic valve: Secondary | ICD-10-CM

## 2013-05-19 DIAGNOSIS — I428 Other cardiomyopathies: Secondary | ICD-10-CM

## 2013-05-19 NOTE — Telephone Encounter (Signed)
Received call from [place] who states that patient had echocardiogram done today per Dr. Martinique order and is c/o fatigue since he last saw Dr. Martinique 2/9.  I went out and spoke with patient and his wife in person who states that since he last saw Dr. Martinique he has had no energy, has no desire to do any of the things that normally bring him pleasure and has had occasional midsternal chest pain without radiation.  Patient is in no acute distress and denies SOB.  I took patient to an exam room.  BP 122/68, HR 60, Respirations 16.    Patient states he is a Psychologist, sport and exercise and has not had the desire to do any of his regular chores.  His wife states he does not get dressed during the day, that he stays in sweat clothes.  Patient is alert and oriented to person, place, time; skin warm, dry, acyanotic.  12-lead ekg was completed and taken to Dr. Marlou Porch, DOD. Dr. Marlou Porch noted that there are no changes in today's ekg and last done on 04/14/13.  Dr. Marlou Porch is aware that patient had echo today.  He advised that patient see his PCP for non-cardiac causes of his fatigue and keep follow-up with Dr. Martinique.  Patient verbalized understanding and agreement and thanked me for my help. I advised patient that I will forward message to Dr. Martinique and Elly Modena, LPN, Dr. Doug Sou primary nurse and that he will receive a call when echo results are available.  Patient ambulated to discharge in no acute distress with his wife.

## 2013-05-19 NOTE — Progress Notes (Signed)
Echo performed. 

## 2013-05-20 ENCOUNTER — Telehealth: Payer: Self-pay

## 2013-05-20 NOTE — Telephone Encounter (Signed)
Spoke to patient echo results given.Advised Dr.Jordan does not think fatigue is heart related.Advised needs to see PCP.Patient stated he has not checked his blood sugar in a while and checked blood sugar last night which was 380.Stated today blood sugar was 270.Advised this will cause fatigue.Adivised blood sugar too high needs to call PCP for appointment.

## 2013-05-27 ENCOUNTER — Ambulatory Visit: Payer: No Typology Code available for payment source | Admitting: Cardiology

## 2013-06-03 ENCOUNTER — Ambulatory Visit: Payer: No Typology Code available for payment source | Admitting: Cardiology

## 2013-06-17 ENCOUNTER — Ambulatory Visit: Payer: No Typology Code available for payment source | Admitting: Cardiology

## 2014-04-15 ENCOUNTER — Other Ambulatory Visit: Payer: Self-pay | Admitting: Cardiology

## 2014-04-16 NOTE — Telephone Encounter (Signed)
Tanner M Martinique, MD at 04/14/2013 2:46 PM  carvedilol (COREG) 6.25 MG tablet 180 tablet 3 04/14/2013   Take 1 tablet (6.25 mg total) by mouth 2 (two) times daily with a meal. - Oral  Assessment / Plan: 1. 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 He has no overt evidence of CHF. Ejection fraction last 40%. He is on Accupril, coreg, and digoxin. I'll followup again in 2 months. We repeat Echo.

## 2014-05-20 ENCOUNTER — Telehealth: Payer: Self-pay | Admitting: Cardiology

## 2014-05-20 ENCOUNTER — Ambulatory Visit (INDEPENDENT_AMBULATORY_CARE_PROVIDER_SITE_OTHER): Payer: Medicare Other | Admitting: Cardiology

## 2014-05-20 ENCOUNTER — Encounter: Payer: Self-pay | Admitting: Cardiology

## 2014-05-20 ENCOUNTER — Other Ambulatory Visit: Payer: Self-pay | Admitting: Cardiology

## 2014-05-20 VITALS — BP 120/56 | HR 60 | Ht 68.0 in | Wt 199.7 lb

## 2014-05-20 DIAGNOSIS — I5022 Chronic systolic (congestive) heart failure: Secondary | ICD-10-CM

## 2014-05-20 DIAGNOSIS — I10 Essential (primary) hypertension: Secondary | ICD-10-CM

## 2014-05-20 DIAGNOSIS — Q231 Congenital insufficiency of aortic valve: Secondary | ICD-10-CM | POA: Diagnosis not present

## 2014-05-20 DIAGNOSIS — I471 Supraventricular tachycardia: Secondary | ICD-10-CM | POA: Diagnosis not present

## 2014-05-20 MED ORDER — DULAGLUTIDE 0.75 MG/0.5ML ~~LOC~~ SOAJ: SUBCUTANEOUS | Status: AC

## 2014-05-20 MED ORDER — CARVEDILOL 6.25 MG PO TABS: 6.2500 mg | ORAL_TABLET | Freq: Two times a day (BID) | ORAL | Status: DC

## 2014-05-20 MED ORDER — QUINAPRIL HCL 20 MG PO TABS: 20.0000 mg | ORAL_TABLET | Freq: Every day | ORAL | Status: DC

## 2014-05-20 MED ORDER — SIMVASTATIN 20 MG PO TABS: 20.0000 mg | ORAL_TABLET | Freq: Every day | ORAL | Status: DC

## 2014-05-20 MED ORDER — ALPRAZOLAM 2 MG PO TABS: 1.0000 mg | ORAL_TABLET | Freq: Two times a day (BID) | ORAL | Status: DC

## 2014-05-20 MED ORDER — PROPAFENONE HCL ER 325 MG PO CP12: 325.0000 mg | ORAL_CAPSULE | Freq: Two times a day (BID) | ORAL | Status: DC

## 2014-05-20 MED ORDER — DIGOXIN 125 MCG PO TABS: 0.1250 mg | ORAL_TABLET | Freq: Every day | ORAL | Status: DC

## 2014-05-20 NOTE — Progress Notes (Signed)
Tanner Williamson Date of Birth: Jan 22, 1946   History of Present Illness: Tanner Williamson is seen for followup atrial tachycardia. He has a long-standing history of an ectopic atrial tachycardia. This has been difficult to manage. He had a radiofrequency catheter ablation in 2006 by Dr.Klein. This resulted in transient complete heart block because of proximity of the focus to the AV node. He then underwent cryoablation in February 2007 at [facility]. This was unsuccessful and he he has been managed with long-term antiarrhythmic drug therapy with Rythmol. He also has a history of tachycardia mediated cardiomyopathy. His echocardiogram in 2011 showed an ejection fraction of 50-55%.  He has a history of a bicuspid aortic valve with only mild stenosis. Repeat echocardiogram in June of 2013 showed global hypokinesis with ejection fraction of 40%. His Accupril dose was increased and he was started on carvedilol. Echo in March 2015 showed normal LV function and mild aortic stenosis.  He denies any significant shortness of breath or chest pain. He has no palpitations or tachycardia. He has a history of lumbar degeneration and is thinking of having surgery this year.   Current Outpatient Prescriptions on File Prior to Visit  Medication Sig Dispense Refill  . alprazolam (XANAX) 2 MG tablet Take 1 mg by mouth 2 (two) times daily.     . carvedilol (COREG) 6.25 MG tablet TAKE ONE TABLET BY MOUTH TWICE DAILY WITH A MEAL 30 tablet 0  . digoxin (LANOXIN) 0.125 MG tablet Take 1 tablet (0.125 mg total) by mouth daily. 90 tablet 3  . ergocalciferol (VITAMIN D2) 50000 UNITS capsule Take 50,000 Units by mouth once a week.    . gabapentin (NEURONTIN) 800 MG tablet Take 800 mg by mouth 2 (two) times daily.    . hydrocortisone (ANUSOL-HC) 25 MG suppository Place 25 mg rectally 2 (two) times daily.    . metFORMIN (GLUCOPHAGE) 500 MG tablet Take 500 mg by mouth 2 (two) times daily with a meal.    . propafenone (RYTHMOL SR)  325 MG 12 hr capsule Take 1 capsule (325 mg total) by mouth 2 (two) times daily. 180 capsule 3  . quinapril (ACCUPRIL) 20 MG tablet Take 1 tablet (20 mg total) by mouth at bedtime. 90 tablet 1  . simvastatin (ZOCOR) 20 MG tablet Take 20 mg by mouth daily.    . traMADol (ULTRAM) 50 MG tablet Take by mouth every 6 (six) hours.     No current facility-administered medications on file prior to visit.    Allergies  Allergen Reactions  . Codeine Nausea Only and Other (See Comments)    Restlessness & headaches  . Glimepiride Diarrhea    Past Medical History  Diagnosis Date  . Bicuspid aortic valve   . Cardiomyopathy     nonischemic  . Hemorrhoids   . Diabetes mellitus     type 2, uncontrolled  . Vitamin D deficiency   . Hyperlipidemia   . Dysthymia   . Benign essential hypertension   . Ectopic atrial tachycardia   . Atrial fibrillation   . Conduction disorder of the heart   . Degeneration of lumbosacral intervertebral disc   . Spinal stenosis of lumbar region   . Chronic pain syndrome   . Hyperglycemia   . Arthralgia   . Back pain   . Squamous cell cancer of skin of nose     removed  . Polyp of colon     by colonoscopy  . GERD (gastroesophageal reflux disease)   .  Sleep apnea   . Anxiety disorder   . Major depressive disorder     Past Surgical History  Procedure Laterality Date  . Cardiac catheterization  09/07/2004    Est. EF at 40% --  No significant obstructive coronary disease  -- Moderate left ventricular dysfunction  . Cholecystectomy  1998  . Carotid endarterectomy       Left carotid endarterectomy with Dacron patch angioplasty.  . Insert / replace / remove pacemaker  03/03/2005    Temporary permanent transvenous pacemaker insertion   . Tee with cardioversion  03/03/2005    confirmed an atrial tachycardia that was originating from the septum somewhere just inferior to the His and superior to the coronary sinus os  . Skin cancer removal      nose  . Carotid  endarterectomy  2003    History  Smoking status  . Current Some Day Smoker -- 2.00 packs/day for 49 years  . Types: Cigarettes  Smokeless tobacco  . Never Used    History  Alcohol Use No    Family History  Problem Relation Age of Onset  . Parkinsonism Mother   . Alzheimer's disease Mother   . Heart attack Father     Review of Systems: Review of systems is as noted in HPI. He reports his diabetes control has improved significantly on Trulicity.  All other systems were reviewed and are negative.  Physical Exam: BP 120/56 mmHg  Pulse 60  Ht 5\' 8"  (1.727 m)  Wt 199 lb 11.2 oz (90.583 kg)  BMI 30.37 kg/m2 WDWM in NAD. The HEENT exam is normal.  The carotids are 2+ without bruits.  There is no thyromegaly.  There is no JVD.  The lungs are clear.    The heart exam reveals a regular rate with a normal S1 and S2.  There is a soft grade 1/6 systolic murmur at the right upper sternal border.   The PMI is not displaced.   Abdominal exam reveals good bowel sounds.  There is no guarding or rebound.  There is no hepatosplenomegaly or tenderness.  There are no masses.  Exam of the legs reveal no clubbing, cyanosis, or edema.  The legs are without rashes.  The distal pulses are intact.  Cranial nerves II - XII are intact.  Motor and sensory functions are intact.  The gait is normal.  LABORATORY DATA: Ecg: today: NSR 1st degree AV block. LVH. Rate 60. I have personally reviewed and interpreted this study.   Assessment / Plan: 1. 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 He has no evidence of CHF. Ejection fraction last 55-60%. He is on Accupril, coreg,  and digoxin.   2. Ectopic atrial tachycardia. This appears to be well-controlled on Rythmol. He notes if he requires EP evaluation in the future he would like to see Dr. Ola Spurr [facility] who saw him previously when he was at [facility].   3. Bicuspid aortic valve with mild stenosis.  4. Back  pain. He is clear for surgery from our standpoint.  5. Hypertension, controlled.

## 2014-05-20 NOTE — Telephone Encounter (Signed)
Ok'd over phone.

## 2014-05-20 NOTE — Patient Instructions (Signed)
Continue your current therapy  I will see you in one year   

## 2014-05-20 NOTE — Telephone Encounter (Signed)
Tanner Williamson is calling from [employer] to see if they can change the manufacture [place] to [employer] for his Digoxin . Please Call  Thanks

## 2014-06-23 ENCOUNTER — Telehealth: Payer: Self-pay

## 2014-06-23 NOTE — Telephone Encounter (Signed)
Received fax for surgical clearance from [facility].Dr.Jordan cleared patient for surgery.Form faxed back at fax # [phone].

## 2014-07-09 ENCOUNTER — Telehealth: Payer: Self-pay | Admitting: Cardiology

## 2014-07-09 NOTE — Telephone Encounter (Signed)
Please call, pt received some bad news yesterday.He needs to see a thoracic surgeon.

## 2014-07-09 NOTE — Telephone Encounter (Signed)
Please call tomorrow.

## 2014-07-09 NOTE — Telephone Encounter (Signed)
Called pt. No answer no machine to leave message

## 2014-07-10 NOTE — Telephone Encounter (Signed)
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

## 2014-07-20 ENCOUNTER — Telehealth: Payer: Self-pay | Admitting: Cardiology

## 2014-07-20 NOTE — Telephone Encounter (Signed)
Returned call to patient no answer.LMTC. 

## 2014-07-20 NOTE — Telephone Encounter (Signed)
Please call, pt is  Going to have surgery,she need some advice from Dr Martinique.

## 2014-07-21 NOTE — Telephone Encounter (Signed)
Returned call to patient's wife.She stated husband is scheduled 07/29/14 to have Thoracotomy procedure to remove 1/3 of right lower lung by Dr.Arthur Rolley Sims at [facility] they were given a book to read describing procedure.Stated they are now concerned if he needs a 2nd opinion.Stated the book strongly advised getting a 2nd opinion.Stated they would feel better if they could discuss this with Dr.Jordan.Dr.Jordan will be back in office 07/23/14.Advised I will let Dr.Jordan know.

## 2014-07-23 NOTE — Telephone Encounter (Signed)
Dr.Jordan returned call to patient's wife.

## 2014-08-13 NOTE — Telephone Encounter (Signed)
Spoke to patient he stated his recent surgery to remove 1/3 of right lower lung went well.Stated he is sore but recovering well.He sees surgeon next week for post hospital follow up.Advised I will let Dr.Jordan know you are doing good.Advised to keep appointment with Dr.Jordan as planned and call if needed.

## 2014-10-02 ENCOUNTER — Ambulatory Visit (INDEPENDENT_AMBULATORY_CARE_PROVIDER_SITE_OTHER): Payer: Medicare Other | Admitting: Nurse Practitioner

## 2014-10-02 ENCOUNTER — Ambulatory Visit (INDEPENDENT_AMBULATORY_CARE_PROVIDER_SITE_OTHER): Payer: Medicare Other

## 2014-10-02 ENCOUNTER — Encounter: Payer: Self-pay | Admitting: Nurse Practitioner

## 2014-10-02 ENCOUNTER — Telehealth: Payer: Self-pay | Admitting: Cardiology

## 2014-10-02 VITALS — BP 100/60 | HR 85 | Ht 68.0 in | Wt 184.0 lb

## 2014-10-02 DIAGNOSIS — I471 Supraventricular tachycardia: Secondary | ICD-10-CM

## 2014-10-02 DIAGNOSIS — I350 Nonrheumatic aortic (valve) stenosis: Secondary | ICD-10-CM

## 2014-10-02 DIAGNOSIS — R002 Palpitations: Secondary | ICD-10-CM | POA: Diagnosis not present

## 2014-10-02 DIAGNOSIS — I951 Orthostatic hypotension: Secondary | ICD-10-CM | POA: Diagnosis not present

## 2014-10-02 DIAGNOSIS — R9431 Abnormal electrocardiogram [ECG] [EKG]: Secondary | ICD-10-CM | POA: Diagnosis not present

## 2014-10-02 MED ORDER — QUINAPRIL HCL 10 MG PO TABS: 10.0000 mg | ORAL_TABLET | Freq: Every day | ORAL | Status: DC

## 2014-10-02 NOTE — Progress Notes (Addendum)
Patient Name: Tanner Williamson Date of Encounter: 10/02/2014  Primary Care Provider:  Elmon Else Primary Cardiologist:  P. Martinique, MD   Chief Complaint  69 year old male with a history of ectopic atrial tachycardia who presents to clinic today secondary to palpitations and orthostasis.  Past Medical History   Past Medical History  Diagnosis Date  . Bicuspid aortic valve     a. 05/2013 Echo: Mild AS, calcified leaflets.  . Nonischemic cardiomyopathy     a. 05/2013 Echo: EF 55-60%, Gr 1 DD.  Marland Kitchen Hemorrhoids   . Type II diabetes mellitus     uncontrolled  . Vitamin D deficiency   . Hyperlipidemia   . Dysthymia   . Benign essential hypertension   . Ectopic atrial tachycardia     a. 2006 s/p RFCA;  b. 04/2005 Cryoablation @ [facility]-->unsuccessful-->Rhythmol.  . Degeneration of lumbosacral intervertebral disc   . Spinal stenosis of lumbar region   . Chronic pain syndrome   . Hyperglycemia   . Arthralgia   . Back pain   . Squamous cell cancer of skin of nose     removed  . Polyp of colon     by colonoscopy  . GERD (gastroesophageal reflux disease)   . Sleep apnea   . Anxiety disorder   . Major depressive disorder   . Cancer of right lung     a. 07/2014 status post right upper lobectomy in [place].   Past Surgical History  Procedure Laterality Date  . Cardiac catheterization  09/07/2004    Est. EF at 40% --  No significant obstructive coronary disease  -- Moderate left ventricular dysfunction  . Cholecystectomy  1998  . Carotid endarterectomy       Left carotid endarterectomy with Dacron patch angioplasty.  . Insert / replace / remove pacemaker  03/03/2005    Temporary permanent transvenous pacemaker insertion   . Tee with cardioversion  03/03/2005    confirmed an atrial tachycardia that was originating from the septum somewhere just inferior to the His and superior to the coronary sinus os  . Skin cancer removal      nose  . Carotid endarterectomy  2003     Allergies  Allergies  Allergen Reactions  . Codeine Nausea Only and Other (See Comments)    Other Reaction: Other reaction Restlessness & headaches  . Glimepiride Diarrhea    HPI  69 year old male with the above complex problem list. He has a history of ectopic atrial tachycardia and is status post prior unsuccessful radio Fregosi catheter ablation in 2006 with subsequent cryoablation at [facility] in 2007. He has been maintained on Rythmol therapy since then and has done quite well without any significant palpitations. He also has a history of mild aortic stenosis in the setting of a bicuspid aortic valve. His last echocardiogram in March 2015 showed normal LV function with grade 1 diastolic dysfunction.  In May of this year, patient underwent right upper lobectomy secondary to lung cancer. This was performed in [place]. He says that his operative and postoperative course were relatively uneventful and that within a few weeks, he had recovered reasonably well. Over the past few weeks however he has noticed a decline in his appetite and some amount of fatigue with reduced exercise tolerance. He has not had any chest pain or dyspnea. He feels that he just tires out easily. He has lost some weight in the setting of a poor appetite. Beginning 4 days ago, he has also been experiencing intermittent  irregular palpitations. He says that these are not necessarily fast but just irregular and bothersome. When palpitations occur, he does feel lightheaded and dizzy. He does not have chest pain or dyspnea during symptoms but does feel very tired and fatigued. Palpitations may last anywhere between 1 and multiple hours. Palpitations persisted for much of the night last night and said he says he was fairly restless. This morning, he has not had any recurrence of palpitations but has been feeling lightheaded when standing. As result, his wife called the office and he was scheduled to see me today. He is in sinus  rhythm today. He denies PND, orthopnea, syncope.  Home Medications  Prior to Admission medications   Medication Sig Start Date End Date Taking? Authorizing Provider  alprazolam Duanne Moron) 2 MG tablet Take 0.5 tablets (1 mg total) by mouth 2 (two) times daily. 05/20/14  Yes [name], MD  carvedilol (COREG) 6.25 MG tablet Take 1 tablet (6.25 mg total) by mouth 2 (two) times daily with a meal. 05/20/14  Yes [name], MD  digoxin (LANOXIN) 0.125 MG tablet Take 1 tablet (0.125 mg total) by mouth daily. 05/20/14  Yes [name], MD  Dulaglutide (TRULICITY) 1.74 YC/1.4GY SOPN Take as directed 05/20/14  Yes [name], MD  ergocalciferol (VITAMIN D2) 50000 UNITS capsule Take 50,000 Units by mouth once a week.   Yes Historical Provider, MD  gabapentin (NEURONTIN) 800 MG tablet Take 800 mg by mouth 2 (two) times daily.   Yes Historical Provider, MD  hydrocortisone (ANUSOL-HC) 25 MG suppository Place 25 mg rectally 2 (two) times daily.   Yes Historical Provider, MD  metFORMIN (GLUCOPHAGE) 500 MG tablet Take 500 mg by mouth 2 (two) times daily with a meal.   Yes Historical Provider, MD  propafenone (RYTHMOL SR) 325 MG 12 hr capsule Take 1 capsule (325 mg total) by mouth 2 (two) times daily. 05/20/14  Yes [name], MD  simvastatin (ZOCOR) 20 MG tablet Take 1 tablet (20 mg total) by mouth daily. 05/20/14  Yes [name], MD  traMADol (ULTRAM) 50 MG tablet Take by mouth every 6 (six) hours.   Yes Historical Provider, MD  VOLTAREN 1 % GEL Apply 1 application topically as needed (FOR PAIN).  02/15/14  Yes Historical Provider, MD  quinapril (ACCUPRIL) 10 MG tablet Take 1 tablet (10 mg total) by mouth daily. 10/02/14   Rogelia Mire, NP    Review of Systems  As above, he has been having fatigue over the past few weeks with reduced appetite and intermittent palpitations over the past 3-4 days. Palpitations have been associated with irregular heartbeat and lightheadedness. He denies chest  pain, dyspnea, PND, orthopnea, syncope.  All other systems reviewed and are otherwise negative except as noted above.  Physical Exam  VS:  BP 100/60 mmHg  Pulse 85  Ht '5\' 8"'$  (1.727 m)  Wt 184 lb (83.462 kg)  BMI 27.98 kg/m2 , BMI Body mass index is 27.98 kg/(m^2).  Orthostatic vital signs: Lying-132/76, heart rate 83. Sitting-115/79, heart rate 83. Standing-112/68, heart rate 89. Standing 3 minutes-114/74, heart rate 96. GEN: Well nourished, well developed, in no acute distress. HEENT: normal. Neck: Supple, no JVD, carotid bruits, or masses. Cardiac: RRR, soft systolic ejection murmur at the right upper sternal border, no rubs, or gallops. No clubbing, cyanosis, edema.  Radials/DP/PT 2+ and equal bilaterally.  Respiratory:  Respirations regular and unlabored, clear to auscultation bilaterally. GI: Soft, nontender, nondistended, BS + x  4. MS: no deformity or atrophy. Skin: warm and dry, no rash. Neuro:  Strength and sensation are intact. Psych: Normal affect.  Accessory Clinical Findings  ECG - regular sinus rhythm, first-degree AV block, 85, LDH, T-wave inversion in leads 23 and aVF as well as V6-these changes are new.  Assessment & Plan  1.  Palpitations: Patient presents with a 4 day history of intermittent irregular palpitations in the absence of significant tachycardia. He does not think that these symptoms aren't any way similar to his prior episodes of ectopic atrial tachycardia. He is in sinus rhythm today. We will obtain a CBC, basic metabolic profile, magnesium, TSH today, and place a 30 day event monitor to uncover his arrhythmia.  2. Ectopic atrial tachycardia: Patient is on chronic Rythmol therapy for this and he has not had any recurrence of tachypalpitations. 30 day event monitoring as above for irregular palpitations.  3. Abnormal ECG: Patient's ECG today shows inferior and lateral T changes which were not present previously. He has not had any chest pain but has been  fatigued for several weeks. He has a prior history of nonobstructive disease on catheterization 2006 but he does have a history of vascular disease and is status post carotid endarterectomy. I will obtain a Lexiscan Cardiolite to rule out ischemia. This will be particularly important in the setting of chronic class IC antiarrhythmics therapy.   4. Orthostatic hypotension: Patient's initial blood pressure today was 100/60. We checked orthostatics and he was 132/76 with a heart rate of 83 while lying and dropped to 114/74 with a heart rate 96 but the time that he was standing for 3 minutes. I will reduce his quinapril dose to 10 mg daily. I recommended that he liberalize his fluid intake a little.  5. Bicuspid aortic valve/mild aortic stenosis: Follow-up echocardiogram as it's been over one year and he is having new symptoms.  6. Disposition: Testing as outlined above. Follow up in clinic in approximately 2 weeks or sooner if necessary.  Murray Hodgkins, NP 10/02/2014, 5:20 PM

## 2014-10-02 NOTE — Telephone Encounter (Signed)
Patient's wife calling to report recurrence of tachycardia episodes over the past two weeks. Since Tuesday, episodes of have worsened. He feels he has been in a tachycardic rhythm since 0800 this morning. He has had an ongoing issue with atrial tachycardia for 10+ years including several attempts to control it (ablation, cryoablation, rhythmol). Patient states episodes are sometimes accompanied by intermittent dizziness and worsening fatigue. Denies dyspnea/SOB. States he became dizzy this past Tuesday (three days ago) and fell in his garden. He recently underwent surgery a couple months ago to remove a lung mass. He felt like he was doing well in recovery until he started having the rapid rhythm again. He takes Rhythmol at this time. Patient scheduled to see Ignacia Bayley, NP, today at 2:30 pm at the [facility]. [employer] in [place]. Provided address to patient's wife, as she is going to drive him to the appointment. Will route this note to Ignacia Bayley as fyi and to Dr. Doug Sou box, as well.

## 2014-10-02 NOTE — Patient Instructions (Addendum)
Medication Instructions:  1) DECREASE your Quinapril to '10mg'$  once daily  Labwork: CBC, BMET, Magnesium, TSH today  Testing/Procedures: Your physician has requested that you have an echocardiogram. Echocardiography is a painless test that uses sound waves to create images of your heart. It provides your doctor with information about the size and shape of your heart and how well your heart's chambers and valves are working. This procedure takes approximately one hour. There are no restrictions for this procedure.  Your physician has requested that you have a lexiscan myoview. For further information please visit [email or web address]. Please follow instruction sheet, as given.  Your physician has recommended that you wear an event monitor. Event monitors are medical devices that record the heart's electrical activity. Doctors most often [place] these monitors to diagnose arrhythmias. Arrhythmias are problems with the speed or rhythm of the heartbeat. The monitor is a small, portable device. You can wear one while you do your normal daily activities. This is usually used to diagnose what is causing palpitations/syncope (passing out).  Follow-Up: Your physician recommends that you schedule a follow-up appointment in: 2 weeks with Dr. Martinique or PA/NP [facility] per Gerald Stabs to add to FLEX schedule)   Any Other Special Instructions Will Be Listed Below (If Applicable).

## 2014-10-02 NOTE — Telephone Encounter (Signed)
Freda Munro ( wife) is calling because Mr.Frei Tachycardia has went back out of rhythm . Its has been speeding up fast , medication seems not to be working . Please call   Thanks

## 2014-10-07 ENCOUNTER — Telehealth (HOSPITAL_COMMUNITY): Payer: Self-pay

## 2014-10-07 NOTE — Telephone Encounter (Signed)
Patient given detailed instructions per Myocardial Perfusion Study Information Sheet for test on 10-12-2014 at 0830. Patient Notified to arrive 15 minutes early, and that it is imperative to arrive on time for appointment to keep from having the test rescheduled. Patient verbalized understanding. Tanner Williamson, Gevorg Brum A

## 2014-10-12 ENCOUNTER — Ambulatory Visit (HOSPITAL_COMMUNITY): Payer: Medicare Other | Attending: Nurse Practitioner

## 2014-10-12 ENCOUNTER — Ambulatory Visit (HOSPITAL_COMMUNITY): Payer: Medicare Other

## 2014-10-12 ENCOUNTER — Telehealth (HOSPITAL_COMMUNITY): Payer: Self-pay | Admitting: *Deleted

## 2014-10-12 DIAGNOSIS — I2510 Atherosclerotic heart disease of native coronary artery without angina pectoris: Secondary | ICD-10-CM | POA: Insufficient documentation

## 2014-10-12 DIAGNOSIS — I429 Cardiomyopathy, unspecified: Secondary | ICD-10-CM | POA: Diagnosis not present

## 2014-10-12 DIAGNOSIS — R9431 Abnormal electrocardiogram [ECG] [EKG]: Secondary | ICD-10-CM | POA: Insufficient documentation

## 2014-10-12 DIAGNOSIS — R42 Dizziness and giddiness: Secondary | ICD-10-CM | POA: Diagnosis not present

## 2014-10-12 DIAGNOSIS — R5383 Other fatigue: Secondary | ICD-10-CM

## 2014-10-12 DIAGNOSIS — I4891 Unspecified atrial fibrillation: Secondary | ICD-10-CM

## 2014-10-12 DIAGNOSIS — I10 Essential (primary) hypertension: Secondary | ICD-10-CM | POA: Diagnosis not present

## 2014-10-12 DIAGNOSIS — C3490 Malignant neoplasm of unspecified part of unspecified bronchus or lung: Secondary | ICD-10-CM | POA: Insufficient documentation

## 2014-10-12 LAB — MYOCARDIAL PERFUSION IMAGING
CHL CUP RESTING HR STRESS: 65 {beats}/min
LVDIAVOL: 165 mL
LVSYSVOL: 102 mL
NUC STRESS TID: 1.21
Peak HR: 77 {beats}/min
RATE: 0.32
SDS: 5
SRS: 2
SSS: 7

## 2014-10-12 MED ORDER — TECHNETIUM TC 99M SESTAMIBI GENERIC - CARDIOLITE
31.2000 | Freq: Once | INTRAVENOUS | Status: AC | PRN
  Administered 2014-10-12: 31.2 via INTRAVENOUS

## 2014-10-12 MED ORDER — TECHNETIUM TC 99M SESTAMIBI GENERIC - CARDIOLITE
10.2000 | Freq: Once | INTRAVENOUS | Status: AC | PRN
  Administered 2014-10-12: 10 via INTRAVENOUS

## 2014-10-12 MED ORDER — REGADENOSON 0.4 MG/5ML IV SOLN
0.4000 mg | Freq: Once | INTRAVENOUS | Status: AC
  Administered 2014-10-12: 0.4 mg via INTRAVENOUS

## 2014-10-16 ENCOUNTER — Telehealth: Payer: Self-pay | Admitting: Cardiology

## 2014-10-16 NOTE — Telephone Encounter (Signed)
I spoke with patient and reviewed myoview results (patient did not have a CT).  Patient and his wife verbalized understanding and will await echo test and results.

## 2014-10-16 NOTE — Telephone Encounter (Signed)
Pt's wife calling to get stress test and CT results-pls call [phone]

## 2014-10-20 ENCOUNTER — Other Ambulatory Visit (HOSPITAL_COMMUNITY): Payer: Medicare Other

## 2014-10-21 ENCOUNTER — Other Ambulatory Visit: Payer: Self-pay | Admitting: Nurse Practitioner

## 2014-10-21 ENCOUNTER — Ambulatory Visit (HOSPITAL_COMMUNITY): Payer: Medicare Other | Attending: Cardiovascular Disease

## 2014-10-21 ENCOUNTER — Other Ambulatory Visit: Payer: Self-pay

## 2014-10-21 DIAGNOSIS — I517 Cardiomegaly: Secondary | ICD-10-CM | POA: Insufficient documentation

## 2014-10-21 DIAGNOSIS — E785 Hyperlipidemia, unspecified: Secondary | ICD-10-CM | POA: Insufficient documentation

## 2014-10-21 DIAGNOSIS — I351 Nonrheumatic aortic (valve) insufficiency: Secondary | ICD-10-CM | POA: Insufficient documentation

## 2014-10-21 DIAGNOSIS — I10 Essential (primary) hypertension: Secondary | ICD-10-CM | POA: Insufficient documentation

## 2014-10-21 DIAGNOSIS — I350 Nonrheumatic aortic (valve) stenosis: Secondary | ICD-10-CM

## 2014-10-23 ENCOUNTER — Telehealth: Payer: Self-pay | Admitting: Cardiology

## 2014-10-23 NOTE — Telephone Encounter (Signed)
F/u        Pt's wife calling Pam P back.

## 2014-10-23 NOTE — Telephone Encounter (Signed)
Returned wife's call. Did not see a DPR in chart for patient. Informed patient's wife of Hippa. Encouraged patient's wife to have patient call us back.

## 2014-10-23 NOTE — Telephone Encounter (Signed)
Patient called back for echo results. Per Tanner Bayley NP, EF mildly depressed from previous value - similar to what was seen on stress test. Aortic valve is stable. Keep f/u with Dr. Martinique on 8/25. Patient verbalized understanding. Patient has given verbal permission to talk to his wife Tanner Williamson, he will fill out DPR at next office visit.

## 2014-10-27 NOTE — Telephone Encounter (Signed)
Patient's wife called this morning stating husband very weak,sob,no energy.Stated he feels like heart is out of rhythm.Stated he saw Ignacia Bayley NP 10/02/14 he had lab work and is wearing a event monitor.Stated she has not received lab results done 10/02/14.Spoke to Iraan in monitors.She reviewed monitor strips which revealed no AFib or AFlutter.Revealed pac's,pvc's,sinus tach.She will fax monitor strips to [facility] office for Dr.Jordan to review.Spoke to [place] in lab at [facility], lab done on 10/02/14 has not been resulted she will call [facility] and fill out a safety portal.Bethany will fax results to me when available.Received bmet,cbc,mag,tsh results from Bethany,will show Dr.Jordan results.   Spoke with wife received lab,cbc,bmet,mag,tsh from 10/02/14.Dr.Jordan will review.Labs looked ok except elevated wbc.Advised wife to have husband keep appointment with Dr.Jordan 10/29/14 at 2:45 pm.Advised to go to ER if needed.

## 2014-10-29 ENCOUNTER — Ambulatory Visit (INDEPENDENT_AMBULATORY_CARE_PROVIDER_SITE_OTHER): Payer: Medicare Other | Admitting: Cardiology

## 2014-10-29 ENCOUNTER — Encounter: Payer: Self-pay | Admitting: Cardiology

## 2014-10-29 VITALS — BP 100/60 | HR 98 | Ht 68.0 in | Wt 184.8 lb

## 2014-10-29 DIAGNOSIS — I5022 Chronic systolic (congestive) heart failure: Secondary | ICD-10-CM | POA: Diagnosis not present

## 2014-10-29 DIAGNOSIS — R002 Palpitations: Secondary | ICD-10-CM

## 2014-10-29 DIAGNOSIS — Q231 Congenital insufficiency of aortic valve: Secondary | ICD-10-CM | POA: Diagnosis not present

## 2014-10-29 DIAGNOSIS — R5383 Other fatigue: Secondary | ICD-10-CM

## 2014-10-29 DIAGNOSIS — I10 Essential (primary) hypertension: Secondary | ICD-10-CM

## 2014-10-29 DIAGNOSIS — I471 Supraventricular tachycardia: Secondary | ICD-10-CM

## 2014-10-29 NOTE — Patient Instructions (Signed)
Continue your current therapy   I will see you in 3 months. 

## 2014-10-29 NOTE — Progress Notes (Signed)
Patient Name: Tanner Williamson Date of Encounter: 10/29/2014  Primary Care Provider:  Elmon Else Primary Cardiologist:  P. Martinique, MD   Chief Complaint  69 year old male with a history of ectopic atrial tachycardia who presents for follow up palpitations and fatigue.  Past Medical History   Past Medical History  Diagnosis Date  . Bicuspid aortic valve     a. 05/2013 Echo: Mild AS, calcified leaflets.  . Nonischemic cardiomyopathy     a. 05/2013 Echo: EF 55-60%, Gr 1 DD.  Marland Kitchen Hemorrhoids   . Type II diabetes mellitus     uncontrolled  . Vitamin D deficiency   . Hyperlipidemia   . Dysthymia   . Benign essential hypertension   . Ectopic atrial tachycardia     a. 2006 s/p RFCA;  b. 04/2005 Cryoablation @ [facility]-->unsuccessful-->Rhythmol.  . Degeneration of lumbosacral intervertebral disc   . Spinal stenosis of lumbar region   . Chronic pain syndrome   . Hyperglycemia   . Arthralgia   . Back pain   . Squamous cell cancer of skin of nose     removed  . Polyp of colon     by colonoscopy  . GERD (gastroesophageal reflux disease)   . Sleep apnea   . Anxiety disorder   . Major depressive disorder   . Cancer of right lung     a. 07/2014 status post right upper lobectomy in [place].   Past Surgical History  Procedure Laterality Date  . Cardiac catheterization  09/07/2004    Est. EF at 40% --  No significant obstructive coronary disease  -- Moderate left ventricular dysfunction  . Cholecystectomy  1998  . Carotid endarterectomy       Left carotid endarterectomy with Dacron patch angioplasty.  . Insert / replace / remove pacemaker  03/03/2005    Temporary permanent transvenous pacemaker insertion   . Tee with cardioversion  03/03/2005    confirmed an atrial tachycardia that was originating from the septum somewhere just inferior to the His and superior to the coronary sinus os  . Skin cancer removal      nose  . Carotid endarterectomy  2003     Allergies  Allergies  Allergen Reactions  . Codeine Nausea Only and Other (See Comments)    Other Reaction: Other reaction Restlessness & headaches  . Glimepiride Diarrhea    HPI  69 year old male with the above complex problem list. He has a history of ectopic atrial tachycardia and is status post prior unsuccessful radiofrequency catheter ablation in 2006 with subsequent cryoablation at [facility] in 2007. He has been maintained on Rythmol therapy since then and has done quite well without any significant palpitations. He also has a history of mild aortic stenosis in the setting of a bicuspid aortic valve. He also has a history of nonischemic cardiomyopathy.  In May of this year, patient underwent right upper lobectomy secondary to lung cancer. This was performed in [place]. He says that his operative and postoperative course were relatively uneventful and that within a few weeks, he had recovered reasonably well. Over the past few weeks however he has noticed a decline in his appetite and increased fatigue with reduced exercise tolerance. He has not had any chest pain or dyspnea. He feels that he just tires out easily. He has lost some weight in the setting of a poor appetite. He was seen by Ignacia Bayley NP for evaluation of palpitations and orthostatic symptoms.  No racing but more skipping. Orthostatic symptoms  have improved with reduction in quinapril dose.  He denies PND, orthopnea, syncope.  Home Medications  Prior to Admission medications   Medication Sig Start Date End Date Taking? Authorizing Provider  alprazolam Duanne Moron) 2 MG tablet Take 0.5 tablets (1 mg total) by mouth 2 (two) times daily. 05/20/14  Yes [name], MD  carvedilol (COREG) 6.25 MG tablet Take 1 tablet (6.25 mg total) by mouth 2 (two) times daily with a meal. 05/20/14  Yes [name], MD  digoxin (LANOXIN) 0.125 MG tablet Take 1 tablet (0.125 mg total) by mouth daily. 05/20/14  Yes [name], MD   Dulaglutide (TRULICITY) 7.82 NF/6.2ZH SOPN Take as directed 05/20/14  Yes [name], MD  ergocalciferol (VITAMIN D2) 50000 UNITS capsule Take 50,000 Units by mouth once a week.   Yes Historical Provider, MD  gabapentin (NEURONTIN) 800 MG tablet Take 800 mg by mouth 2 (two) times daily.   Yes Historical Provider, MD  hydrocortisone (ANUSOL-HC) 25 MG suppository Place 25 mg rectally 2 (two) times daily.   Yes Historical Provider, MD  metFORMIN (GLUCOPHAGE) 500 MG tablet Take 500 mg by mouth 2 (two) times daily with a meal.   Yes Historical Provider, MD  propafenone (RYTHMOL SR) 325 MG 12 hr capsule Take 1 capsule (325 mg total) by mouth 2 (two) times daily. 05/20/14  Yes [name], MD  simvastatin (ZOCOR) 20 MG tablet Take 1 tablet (20 mg total) by mouth daily. 05/20/14  Yes [name], MD  traMADol (ULTRAM) 50 MG tablet Take by mouth every 6 (six) hours.   Yes Historical Provider, MD  VOLTAREN 1 % GEL Apply 1 application topically as needed (FOR PAIN).  02/15/14  Yes Historical Provider, MD  quinapril (ACCUPRIL) 10 MG tablet Take 1 tablet (10 mg total) by mouth daily. 10/02/14   Rogelia Mire, NP    Review of Systems  As noted in HPI. All other systems reviewed and are otherwise negative except as noted above.  Physical Exam  VS:  BP 100/60 mmHg  Pulse 98  Ht '5\' 8"'$  (1.727 m)  Wt 83.825 kg (184 lb 12.8 oz)  BMI 28.11 kg/m2 , BMI Body mass index is 28.11 kg/(m^2).   GEN: Well nourished, well developed, in no acute distress. HEENT: normal. Neck: Supple, no JVD, carotid bruits, or masses. Cardiac: RRR, soft systolic ejection murmur at the right upper sternal border, no rubs, or gallops. No clubbing, cyanosis, edema.  Radials/DP/PT 2+ and equal bilaterally.  Respiratory:  Respirations regular and unlabored, clear to auscultation bilaterally. GI: Soft, nontender, nondistended, BS + x 4. MS: no deformity or atrophy. Skin: warm and dry, no rash. Neuro:  Strength and sensation  are intact. Psych: Normal affect.  Accessory Clinical Findings Event monitor shows Pacs and Pvcs without sustained tachycardia.   Ecg today shows NSR with PACs, LVH with repolarization abnormality. I have personally reviewed and interpreted this study.   Labs dated 10/02/14: WBC 12.4, Hgb 15.2, plts 223K. Mg 1.9, TSH 1.83.   Echo: 10/22/14:Study Conclusions  - Left ventricle: The cavity size was normal. There was mild focal basal hypertrophy of the septum. Systolic function was mildly reduced. The estimated ejection fraction was in the range of 45% to 50%. Diffuse hypokinesis. Doppler parameters are consistent with abnormal left ventricular relaxation (grade 1 diastolic dysfunction). - Aortic valve: Possibly bicuspid; moderately thickened, moderately calcified leaflets. Cusp separation was reduced. Transvalvular velocity was minimally increased. There was no stenosis. There was mild regurgitation  directed eccentrically in the LVOT. Mean gradient (S): 9 mm Hg. Peak gradient (S): 18 mm Hg. Valve area (VTI): 2.33 cm^2. Valve area (Vmax): 2.1 cm^2. Valve area (Vmean): 2.38 cm^2. - Left atrium: The atrium was mildly dilated.  Myoview 10/12/14:  Study Highlights     Nuclear stress EF: 38%.  There was no ST segment deviation noted during stress.  Defect 1: There is a fixed defect present in the apex location. No ischemia.     Assessment & Plan  1.  Palpitations: Event monitor shows PACs and PVCs but no atrial tachycardia. Will continue Rhythmol. Continue coreg.   2. Ectopic atrial tachycardia: Patient is on chronic Rythmol therapy for this and he has not had any recurrence of tachypalpitations.   3. Abnormal ECG: Lexiscnan myoview shows reduced EF 40% without perfusion abnormality. Echo showed EF 45-50% with diffuse hypokinesis. Bicuspid AV with mild AI. Findings are similar to Echo in 2013. I tried to review Echo from 2015 that reported normal EF but study is  not in system. He is on Coreg, dig, and quinapril. Does not appear volume overloaded. There is a question of continuing Rhythmol in patient with reduced EF but he has been on the chronically for paroxysmal atrial tachycardia that failed multiple ablation attempts. Will continue for now.   4. Orthostatic hypotension: improved with reduction in medication.  5. Bicuspid aortic valve/mild aortic stenosis/regurgitation.  6. Disposition: I think a lot of his symptoms are still related to his major lung surgery. Hopefully he will improve with more time. I will follow up in 3 months.  Peter Martinique, MD,FACC  10/29/2014, 3:46 PM

## 2014-11-06 ENCOUNTER — Ambulatory Visit: Payer: No Typology Code available for payment source | Admitting: Cardiology

## 2014-11-10 ENCOUNTER — Telehealth: Payer: Self-pay | Admitting: Cardiology

## 2014-11-10 NOTE — Telephone Encounter (Signed)
Returned call to patient's wife no answer.No voice mail,unable to leave voice message.

## 2014-11-10 NOTE — Telephone Encounter (Signed)
Pt's wife called in wanting to speak with Malachy Mood about the pt's medications. Please f/u with her   Thanks

## 2014-11-11 NOTE — Telephone Encounter (Signed)
Returned call to patient's wife.She stated she would like a note faxed to PCP that says no pain medication.She stated she felt like husband did not need to be taking narcotic pain medicine.Advised will speak to Dr.Jordan 11/12/14 and call you back.

## 2014-11-12 NOTE — Telephone Encounter (Signed)
Returned call to patient's wife no answer.No voice mail unable to leave a message.

## 2014-11-16 NOTE — Telephone Encounter (Signed)
Returned call to patient's wife no answer.LMTC. 

## 2014-11-17 NOTE — Telephone Encounter (Signed)
Returned call to patient's wife 11/16/14.Dr.Jordan advised patient should take pain medication only if needed.Unable to fax note to PCP.

## 2014-12-08 ENCOUNTER — Telehealth: Payer: Self-pay | Admitting: Cardiology

## 2014-12-08 ENCOUNTER — Other Ambulatory Visit: Payer: Self-pay | Admitting: *Deleted

## 2014-12-08 DIAGNOSIS — I471 Supraventricular tachycardia: Secondary | ICD-10-CM

## 2014-12-08 MED ORDER — CARVEDILOL 6.25 MG PO TABS: 6.2500 mg | ORAL_TABLET | Freq: Two times a day (BID) | ORAL | Status: DC

## 2014-12-08 MED ORDER — SIMVASTATIN 20 MG PO TABS: 20.0000 mg | ORAL_TABLET | Freq: Every day | ORAL | Status: DC

## 2014-12-08 MED ORDER — DIGOXIN 125 MCG PO TABS: 0.1250 mg | ORAL_TABLET | Freq: Every day | ORAL | Status: DC

## 2014-12-08 NOTE — Telephone Encounter (Signed)
Pt is returning Cheryl's call   Thanks

## 2014-12-08 NOTE — Telephone Encounter (Signed)
Received call from patient's wife.She stated [facility] will be faxing form for Dr.Jordan to sign for husband to receive 3 free medications.Advised we have not received form this afternoon.I will be looking for form and will call her back to let her know when we receive.

## 2014-12-08 NOTE — Telephone Encounter (Signed)
Returned call to patient's wife no answer.LMTC. 

## 2014-12-08 NOTE — Telephone Encounter (Signed)
Pt is returning Cheryl's call  Thanks

## 2014-12-10 ENCOUNTER — Other Ambulatory Visit: Payer: Self-pay | Admitting: Cardiology

## 2014-12-10 DIAGNOSIS — I471 Supraventricular tachycardia: Secondary | ICD-10-CM

## 2014-12-10 DIAGNOSIS — I4719 Other supraventricular tachycardia: Secondary | ICD-10-CM

## 2014-12-10 MED ORDER — CARVEDILOL 6.25 MG PO TABS: 6.2500 mg | ORAL_TABLET | Freq: Two times a day (BID) | ORAL | Status: DC

## 2014-12-10 MED ORDER — SIMVASTATIN 20 MG PO TABS: 20.0000 mg | ORAL_TABLET | Freq: Every day | ORAL | Status: DC

## 2014-12-10 MED ORDER — DIGOXIN 125 MCG PO TABS: 0.1250 mg | ORAL_TABLET | Freq: Every day | ORAL | Status: DC

## 2014-12-14 ENCOUNTER — Other Ambulatory Visit: Payer: Self-pay | Admitting: *Deleted

## 2014-12-14 MED ORDER — QUINAPRIL HCL 10 MG PO TABS: 10.0000 mg | ORAL_TABLET | Freq: Every day | ORAL | Status: DC

## 2014-12-14 NOTE — Telephone Encounter (Signed)
Can this encounter be closed?

## 2014-12-14 NOTE — Telephone Encounter (Signed)
Spoke to wife I never received form from [employer] for Dr.Jordan to sign for free medication.Stated she will call [facility] to have them fax form.

## 2014-12-28 ENCOUNTER — Telehealth: Payer: Self-pay | Admitting: Cardiology

## 2014-12-28 DIAGNOSIS — I471 Supraventricular tachycardia: Secondary | ICD-10-CM

## 2014-12-28 MED ORDER — DIGOXIN 125 MCG PO TABS
0.1250 mg | ORAL_TABLET | Freq: Every day | ORAL | Status: DC
Start: 2014-12-28 — End: 2015-05-19

## 2014-12-28 MED ORDER — SIMVASTATIN 20 MG PO TABS: 20.0000 mg | ORAL_TABLET | Freq: Every day | ORAL | Status: DC

## 2014-12-28 MED ORDER — CARVEDILOL 6.25 MG PO TABS: 6.2500 mg | ORAL_TABLET | Freq: Two times a day (BID) | ORAL | Status: DC

## 2014-12-28 NOTE — Telephone Encounter (Signed)
Spoke to patient's wife she stated husband needed 90 day refills on carvedilol,simvastatin,digoxin.Advised 90 days refills already sent to [facility].

## 2014-12-28 NOTE — Telephone Encounter (Signed)
Freda Munro is calling about Mr.Klingberg medication( all three that were faxed in for a 30 day supply versus a 90 day ) . The medications were Simivastatin , Carvediolol, and Digoxin .If this can be corrected this morning their mail order pharamcy can shipped them out today ,([facility]) .Marland Kitchen Please call if you have any questions .Marland Kitchen Thanks

## 2014-12-28 NOTE — Telephone Encounter (Signed)
Rx(s) sent to preferred mail order pharmacy.

## 2015-02-08 ENCOUNTER — Ambulatory Visit: Payer: Medicare Other | Admitting: Cardiology

## 2015-02-15 ENCOUNTER — Other Ambulatory Visit: Payer: Self-pay | Admitting: Cardiology

## 2015-03-10 ENCOUNTER — Ambulatory Visit: Payer: Medicare Other | Admitting: Cardiology

## 2015-03-19 ENCOUNTER — Telehealth: Payer: Self-pay

## 2015-03-19 NOTE — Telephone Encounter (Signed)
Received a call from patient's wife.She stated Rythmol's price has increased.Insurance agent advised to call [facility] for tier reduction.[facility] called [phone].

## 2015-03-23 ENCOUNTER — Telehealth: Payer: Self-pay

## 2015-03-23 NOTE — Telephone Encounter (Signed)
[facility] called 03/19/15 [phone] ID # [id number].Requested tier reduction for Rhythmol PA # [id number].  Received letter 03/22/15 from [employer] tier reduction denied.Wife notified of denial.

## 2015-04-26 ENCOUNTER — Ambulatory Visit: Payer: Medicare Other | Admitting: Cardiology

## 2015-04-27 ENCOUNTER — Other Ambulatory Visit: Payer: Self-pay | Admitting: Cardiology

## 2015-04-28 ENCOUNTER — Telehealth: Payer: Self-pay

## 2015-04-28 ENCOUNTER — Other Ambulatory Visit: Payer: Self-pay | Admitting: *Deleted

## 2015-04-28 MED ORDER — PROPAFENONE HCL ER 325 MG PO CP12: 325.0000 mg | ORAL_CAPSULE | Freq: Two times a day (BID) | ORAL | Status: DC

## 2015-04-28 NOTE — Telephone Encounter (Signed)
Rx(s) sent to pharmacy electronically.  

## 2015-04-28 NOTE — Telephone Encounter (Signed)
Received a call from patient's wife requesting refill for rythmol.Refill sent to pharmacy.

## 2015-05-13 ENCOUNTER — Ambulatory Visit: Payer: Medicare Other | Admitting: Cardiology

## 2015-05-19 ENCOUNTER — Telehealth: Payer: Self-pay | Admitting: Cardiology

## 2015-05-19 DIAGNOSIS — I471 Supraventricular tachycardia: Secondary | ICD-10-CM

## 2015-05-19 MED ORDER — CARVEDILOL 6.25 MG PO TABS: 6.2500 mg | ORAL_TABLET | Freq: Two times a day (BID) | ORAL | Status: DC

## 2015-05-19 MED ORDER — DIGOXIN 125 MCG PO TABS: 0.1250 mg | ORAL_TABLET | Freq: Every day | ORAL | Status: DC

## 2015-05-19 MED ORDER — SIMVASTATIN 20 MG PO TABS: 20.0000 mg | ORAL_TABLET | Freq: Every day | ORAL | Status: DC

## 2015-05-19 NOTE — Telephone Encounter (Signed)
Refill sent.

## 2015-05-19 NOTE — Telephone Encounter (Signed)
°*  STAT* If patient is at the pharmacy, call can be transferred to refill team.   1. Which medications need to be refilled? (please list name of each medication and dose if known) Simvastatin, Digoxin, Carvedilol  2. Which pharmacy/location (including street and city if local pharmacy) is medication to be sent to? [facility]  3. Do they need a 30 day or 90 day supply? [place]

## 2015-07-19 ENCOUNTER — Other Ambulatory Visit: Payer: Self-pay | Admitting: Cardiology

## 2015-07-19 NOTE — Telephone Encounter (Signed)
Rx request sent to pharmacy.  

## 2015-07-21 ENCOUNTER — Ambulatory Visit (INDEPENDENT_AMBULATORY_CARE_PROVIDER_SITE_OTHER): Payer: Medicare Other | Admitting: Cardiology

## 2015-07-21 ENCOUNTER — Encounter: Payer: Self-pay | Admitting: Cardiology

## 2015-07-21 VITALS — BP 134/68 | HR 72 | Ht 68.0 in | Wt 195.0 lb

## 2015-07-21 DIAGNOSIS — I10 Essential (primary) hypertension: Secondary | ICD-10-CM

## 2015-07-21 DIAGNOSIS — I471 Supraventricular tachycardia: Secondary | ICD-10-CM | POA: Diagnosis not present

## 2015-07-21 DIAGNOSIS — Q231 Congenital insufficiency of aortic valve: Secondary | ICD-10-CM | POA: Diagnosis not present

## 2015-07-21 DIAGNOSIS — I5022 Chronic systolic (congestive) heart failure: Secondary | ICD-10-CM

## 2015-07-21 MED ORDER — DIGOXIN 125 MCG PO TABS: 0.1250 mg | ORAL_TABLET | Freq: Every day | ORAL | Status: DC

## 2015-07-21 MED ORDER — PROPAFENONE HCL ER 325 MG PO CP12: 325.0000 mg | ORAL_CAPSULE | Freq: Two times a day (BID) | ORAL | Status: DC

## 2015-07-21 MED ORDER — CARVEDILOL 6.25 MG PO TABS
6.2500 mg | ORAL_TABLET | Freq: Two times a day (BID) | ORAL | Status: DC
Start: 2015-07-21 — End: 2016-09-24

## 2015-07-21 MED ORDER — QUINAPRIL HCL 10 MG PO TABS
10.0000 mg | ORAL_TABLET | Freq: Every day | ORAL | Status: DC
Start: 2015-07-21 — End: 2016-01-24

## 2015-07-21 MED ORDER — SIMVASTATIN 20 MG PO TABS: 20.0000 mg | ORAL_TABLET | Freq: Every day | ORAL | Status: DC

## 2015-07-21 MED ORDER — ALPRAZOLAM 2 MG PO TABS: 1.0000 mg | ORAL_TABLET | Freq: Two times a day (BID) | ORAL | Status: DC

## 2015-07-21 MED ORDER — QUINAPRIL HCL 10 MG PO TABS: 10.0000 mg | ORAL_TABLET | Freq: Every day | ORAL | Status: DC

## 2015-07-21 NOTE — Progress Notes (Signed)
Patient Name: Tanner Williamson Date of Encounter: 07/21/2015  Primary Care Provider:  Burnard Bunting, MD Primary Cardiologist:  P. Martinique, MD   Chief Complaint  70 year old male with a history of ectopic atrial tachycardia who presents for follow up  Past Medical History   Past Medical History  Diagnosis Date  . Bicuspid aortic valve     a. 05/2013 Echo: Mild AS, calcified leaflets.  . Nonischemic cardiomyopathy ([place])     a. 05/2013 Echo: EF 55-60%, Gr 1 DD.  Marland Kitchen Hemorrhoids   . Type II diabetes mellitus ([facility])     uncontrolled  . Vitamin D deficiency   . Hyperlipidemia   . Dysthymia   . Benign essential hypertension   . Ectopic atrial tachycardia ([place])     a. 2006 s/p RFCA;  b. 04/2005 Cryoablation @ [facility]-->unsuccessful-->Rhythmol.  . Degeneration of lumbosacral intervertebral disc   . Spinal stenosis of lumbar region   . Chronic pain syndrome   . Hyperglycemia   . Arthralgia   . Back pain   . Squamous cell cancer of skin of nose     removed  . Polyp of colon     by colonoscopy  . GERD (gastroesophageal reflux disease)   . Sleep apnea   . Anxiety disorder   . Major depressive disorder ([place])   . Cancer of right lung ([place])     a. 07/2014 status post right upper lobectomy in [place].   Past Surgical History  Procedure Laterality Date  . Cardiac catheterization  09/07/2004    Est. EF at 40% --  No significant obstructive coronary disease  -- Moderate left ventricular dysfunction  . Cholecystectomy  1998  . Carotid endarterectomy       Left carotid endarterectomy with Dacron patch angioplasty.  . Insert / replace / remove pacemaker  03/03/2005    Temporary permanent transvenous pacemaker insertion   . Tee with cardioversion  03/03/2005    confirmed an atrial tachycardia that was originating from the septum somewhere just inferior to the His and superior to the coronary sinus os  . Skin cancer removal      nose  . Carotid endarterectomy  2003     Allergies  Allergies  Allergen Reactions  . Codeine Nausea Only and Other (See Comments)    Other Reaction: Other reaction Restlessness & headaches  . Glimepiride Diarrhea    HPI  70 year old male with the above complex problem list. He has a history of ectopic atrial tachycardia and is status post prior unsuccessful radiofrequency catheter ablation in 2006 with subsequent cryoablation at [facility] in 2007. He has been maintained on Rythmol therapy since then and has done quite well without any significant palpitations. He also has a history of mild aortic stenosis in the setting of a bicuspid aortic valve. He  has a history of nonischemic cardiomyopathy.  In May of 2016, patient underwent right upper lobectomy secondary to lung cancer. This was performed in [place]. He initially experienced symptoms of fatigue and loss of appetite but has since recovered and feels very well on follow up today. He states his heart races infrequently but never lasts long. He quit smoking one year ago but does chew tobacco.   Home Medications  Prior to Admission medications   Medication Sig Start Date End Date Taking? Authorizing Provider  alprazolam Duanne Moron) 2 MG tablet Take 0.5 tablets (1 mg total) by mouth 2 (two) times daily. 05/20/14  Yes Ruthella Kirchman M Martinique, MD  carvedilol (COREG) 6.25  MG tablet Take 1 tablet (6.25 mg total) by mouth 2 (two) times daily with a meal. 05/20/14  Yes Parthenia Tellefsen M Martinique, MD  digoxin (LANOXIN) 0.125 MG tablet Take 1 tablet (0.125 mg total) by mouth daily. 05/20/14  Yes Tyree Fluharty M Martinique, MD  Dulaglutide (TRULICITY) 6.83 MH/9.6QI SOPN Take as directed 05/20/14  Yes Jaszmine Navejas M Martinique, MD  ergocalciferol (VITAMIN D2) 50000 UNITS capsule Take 50,000 Units by mouth once a week.   Yes Historical Provider, MD  gabapentin (NEURONTIN) 800 MG tablet Take 800 mg by mouth 2 (two) times daily.   Yes Historical Provider, MD  hydrocortisone (ANUSOL-HC) 25 MG suppository Place 25 mg rectally 2 (two) times  daily.   Yes Historical Provider, MD  metFORMIN (GLUCOPHAGE) 500 MG tablet Take 500 mg by mouth 2 (two) times daily with a meal.   Yes Historical Provider, MD  propafenone (RYTHMOL SR) 325 MG 12 hr capsule Take 1 capsule (325 mg total) by mouth 2 (two) times daily. 05/20/14  Yes Brenn Deziel M Martinique, MD  simvastatin (ZOCOR) 20 MG tablet Take 1 tablet (20 mg total) by mouth daily. 05/20/14  Yes Elzada Pytel M Martinique, MD  traMADol (ULTRAM) 50 MG tablet Take by mouth every 6 (six) hours.   Yes Historical Provider, MD  VOLTAREN 1 % GEL Apply 1 application topically as needed (FOR PAIN).  02/15/14  Yes Historical Provider, MD  quinapril (ACCUPRIL) 10 MG tablet Take 1 tablet (10 mg total) by mouth daily. 10/02/14   Rogelia Mire, NP    Review of Systems  As noted in HPI. All other systems reviewed and are otherwise negative except as noted above.  Physical Exam  VS:  BP 134/68 mmHg  Pulse 72  Ht '5\' 8"'$  (1.727 m)  Wt 88.451 kg (195 lb)  BMI 29.66 kg/m2 , BMI Body mass index is 29.66 kg/(m^2).   GEN: Well nourished, well developed, in no acute distress. HEENT: normal. Neck: Supple, no JVD, carotid bruits, or masses. Cardiac: RRR, soft systolic ejection murmur at the right upper sternal border, no rubs, or gallops. No clubbing, cyanosis, edema.  Radials/DP/PT 2+ and equal bilaterally.  Respiratory:  Respirations regular and unlabored, clear to auscultation bilaterally. GI: Soft, nontender, nondistended, BS + x 4. MS: no deformity or atrophy. Skin: warm and dry, no rash. Neuro:  Strength and sensation are intact. Psych: Normal affect.  Accessory Clinical Findings Ecg today shows NSR .LVH with repolarization abnormality. Axis shifted due to limb lead reversal. I have personally reviewed and interpreted this study.     Assessment & Plan  1.   Ectopic atrial tachycardia: Patient is on chronic Rythmol therapy. Well controlled. Rx refilled.   2.  Nonischemic cardiomyopathy.  Lexiscnan myoview shows  reduced EF 40% without perfusion abnormality. Echo showed EF 45-50% with diffuse hypokinesis. Bicuspid AV with mild AI. Findings are similar to Echo in 2013.  He is on Coreg, dig, and quinapril. Does not appear volume overloaded. There is a question of continuing Rhythmol in patient with reduced EF but he has been on the chronically for paroxysmal atrial tachycardia that failed multiple ablation attempts. Will continue for now.   3.  Bicuspid aortic valve/mild aortic stenosis/regurgitation.  4. Lung CA s/p resection. Doing well. Follow up scans next month.  Follow up in 6 months.  Uel Davidow Martinique, MD,FACC  07/21/2015, 1:07 PM

## 2015-07-21 NOTE — Patient Instructions (Addendum)
Continue your current therapy  Avoid caffeine  I will see you in 6 months.

## 2015-08-05 ENCOUNTER — Ambulatory Visit: Payer: Medicare Other | Admitting: Cardiology

## 2015-11-01 ENCOUNTER — Telehealth: Payer: Self-pay | Admitting: Cardiology

## 2015-11-01 NOTE — Telephone Encounter (Signed)
Received a call from patient's wife stating husband is scheduled to have prostate cancer surgery this Wed 11/03/15 at [facility] by [facility] she spoke to [place] with anesthesia and she ask her to call and have last office note,and all test faxed to her at fax # [phone].Advised I will send message to [place] for cardiac clearance.

## 2015-11-02 NOTE — Telephone Encounter (Signed)
He is clear for prostate surgery from my standpoint.  Jasslyn Finkel Martinique MD, [facility]

## 2015-11-02 NOTE — Telephone Encounter (Signed)
Spoke to wife Dr.Jordan cleared pt for prostate surgery 11/03/15.07/21/15 office note,ekg,myoview,echo faxed to [facility] [place] at fax # [phone].

## 2015-12-08 ENCOUNTER — Telehealth: Payer: Self-pay

## 2015-12-08 NOTE — Telephone Encounter (Signed)
Patient called left message on personal voice Handicap form completed and will mailed to your home.

## 2016-01-24 ENCOUNTER — Other Ambulatory Visit: Payer: Self-pay | Admitting: Cardiology

## 2016-01-24 ENCOUNTER — Telehealth: Payer: Self-pay | Admitting: Cardiology

## 2016-01-24 MED ORDER — QUINAPRIL HCL 20 MG PO TABS: 10.0000 mg | ORAL_TABLET | Freq: Every day | ORAL | 3 refills | Status: DC

## 2016-01-24 NOTE — Telephone Encounter (Signed)
Returned call. Quinapril refilled at '20mg'$  tablet strength w instructions to break in half, per patient preference. (he has filled this way to save money). Phone was disconnected, I attempted to redial, no answer when I called back. Noted that patient had also asked for xanax refill. I will need to send to Dr. Martinique for authorization.

## 2016-01-24 NOTE — Telephone Encounter (Signed)
New message  Pt has questions about meds  Quinapril '10mg'$  Xanax '2mg'$   Please call [facility]

## 2016-01-24 NOTE — Telephone Encounter (Signed)
It is OK to refill Xanax for Ron  Peter Martinique MD, [facility]

## 2016-01-24 NOTE — Telephone Encounter (Signed)
Returned call and informed wife. Meds called to [place] at her request.

## 2016-02-28 NOTE — Progress Notes (Deleted)
Patient Name: Tanner Williamson Date of Encounter: 02/28/2016  Primary Care Provider:  Burnard Bunting, MD Primary Cardiologist:  P. Martinique, MD   Chief Complaint  70 year old male with a history of ectopic atrial tachycardia who presents for follow up  Past Medical History   Past Medical History:  Diagnosis Date  . Anxiety disorder   . Arthralgia   . Back pain   . Benign essential hypertension   . Bicuspid aortic valve    a. 05/2013 Echo: Mild AS, calcified leaflets.  . Cancer of right lung ([place])    a. 07/2014 status post right upper lobectomy in [place].  . Chronic pain syndrome   . Degeneration of lumbosacral intervertebral disc   . Dysthymia   . Ectopic atrial tachycardia ([place])    a. 2006 s/p RFCA;  b. 04/2005 Cryoablation @ [facility]-->unsuccessful-->Rhythmol.  Marland Kitchen GERD (gastroesophageal reflux disease)   . Hemorrhoids   . Hyperglycemia   . Hyperlipidemia   . Major depressive disorder   . Nonischemic cardiomyopathy ([place])    a. 05/2013 Echo: EF 55-60%, Gr 1 DD.  Marland Kitchen Polyp of colon    by colonoscopy  . Sleep apnea   . Spinal stenosis of lumbar region   . Squamous cell cancer of skin of nose    removed  . Type II diabetes mellitus (HCC)    uncontrolled  . Vitamin D deficiency    Past Surgical History:  Procedure Laterality Date  . CARDIAC CATHETERIZATION  09/07/2004   Est. EF at 40% --  No significant obstructive coronary disease  -- Moderate left ventricular dysfunction  . CAROTID ENDARTERECTOMY      Left carotid endarterectomy with Dacron patch angioplasty.  . CAROTID ENDARTERECTOMY  2003  . CHOLECYSTECTOMY  1998  . INSERT / REPLACE / REMOVE PACEMAKER  03/03/2005   Temporary permanent transvenous pacemaker insertion   . skin cancer removal     nose  . TEE WITH CARDIOVERSION  03/03/2005   confirmed an atrial tachycardia that was originating from the septum somewhere just inferior to the His and superior to the coronary sinus os    Allergies  Allergies    Allergen Reactions  . Codeine Nausea Only and Other (See Comments)    Other Reaction: Other reaction Restlessness & headaches  . Glimepiride Diarrhea    HPI  70 year old male with the above complex problem list. He has a history of ectopic atrial tachycardia and is status post prior unsuccessful radiofrequency catheter ablation in 2006 with subsequent cryoablation at [facility] in 2007. He has been maintained on Rythmol therapy since then and has done quite well without any significant palpitations. He also has a history of mild aortic stenosis in the setting of a bicuspid aortic valve. He  has a history of nonischemic cardiomyopathy.  In May of 2016, patient underwent right upper lobectomy secondary to lung cancer. This was performed in [place]. He initially experienced symptoms of fatigue and loss of appetite but has since recovered and feels very well on follow up today. He states his heart races infrequently but never lasts long. He quit smoking one year ago but does chew tobacco.   Home Medications  Prior to Admission medications   Medication Sig Start Date End Date Taking? Authorizing Provider  alprazolam Duanne Moron) 2 MG tablet Take 0.5 tablets (1 mg total) by mouth 2 (two) times daily. 05/20/14  Yes Lillyanna Glandon M Martinique, MD  carvedilol (COREG) 6.25 MG tablet Take 1 tablet (6.25 mg total) by mouth 2 (two)  times daily with a meal. 05/20/14  Yes Elliyah Liszewski M Martinique, MD  digoxin (LANOXIN) 0.125 MG tablet Take 1 tablet (0.125 mg total) by mouth daily. 05/20/14  Yes Chestine Belknap M Martinique, MD  Dulaglutide (TRULICITY) 1.61 WR/6.0AV SOPN Take as directed 05/20/14  Yes Elsi Stelzer M Martinique, MD  ergocalciferol (VITAMIN D2) 50000 UNITS capsule Take 50,000 Units by mouth once a week.   Yes Historical Provider, MD  gabapentin (NEURONTIN) 800 MG tablet Take 800 mg by mouth 2 (two) times daily.   Yes Historical Provider, MD  hydrocortisone (ANUSOL-HC) 25 MG suppository Place 25 mg rectally 2 (two) times daily.   Yes Historical  Provider, MD  metFORMIN (GLUCOPHAGE) 500 MG tablet Take 500 mg by mouth 2 (two) times daily with a meal.   Yes Historical Provider, MD  propafenone (RYTHMOL SR) 325 MG 12 hr capsule Take 1 capsule (325 mg total) by mouth 2 (two) times daily. 05/20/14  Yes Aryahna Spagna M Martinique, MD  simvastatin (ZOCOR) 20 MG tablet Take 1 tablet (20 mg total) by mouth daily. 05/20/14  Yes Velmer Broadfoot M [place]ique, MD  traMADol (ULTRAM) 50 MG tablet Take by mouth every 6 (six) hours.   Yes Historical Provider, MD  VOLTAREN 1 % GEL Apply 1 application topically as needed (FOR PAIN).  02/15/14  Yes Historical Provider, MD  quinapril (ACCUPRIL) 10 MG tablet Take 1 tablet (10 mg total) by mouth daily. 10/02/14   Rogelia Mire, NP    Review of Systems  As noted in HPI. All other systems reviewed and are otherwise negative except as noted above.  Physical Exam  VS:  There were no vitals taken for this visit. , BMI There is no height or weight on file to calculate BMI.   GEN: Well nourished, well developed, in no acute distress. HEENT: normal. Neck: Supple, no JVD, carotid bruits, or masses. Cardiac: RRR, soft systolic ejection murmur at the right upper sternal border, no rubs, or gallops. No clubbing, cyanosis, edema.  Radials/DP/PT 2+ and equal bilaterally.  Respiratory:  Respirations regular and unlabored, clear to auscultation bilaterally. GI: Soft, nontender, nondistended, BS + x 4. MS: no deformity or atrophy. Skin: warm and dry, no rash. Neuro:  Strength and sensation are intact. Psych: Normal affect.  Accessory Clinical Findings   Ecg today shows NSR .LVH with repolarization abnormality. Axis shifted due to limb lead reversal. I have personally reviewed and interpreted this study.     Assessment & Plan  1.   Ectopic atrial tachycardia: Patient is on chronic Rythmol therapy. Well controlled. Rx refilled.   2.  Nonischemic cardiomyopathy.  Lexiscnan myoview shows reduced EF 40% without perfusion abnormality.  Echo showed EF 45-50% with diffuse hypokinesis. Bicuspid AV with mild AI. Findings are similar to Echo in 2013.  He is on Coreg, dig, and quinapril. Does not appear volume overloaded. There is a question of continuing Rhythmol in patient with reduced EF but he has been on the chronically for paroxysmal atrial tachycardia that failed multiple ablation attempts. Will continue for now.   3.  Bicuspid aortic valve/mild aortic stenosis/regurgitation.  4. Lung CA s/p resection. Doing well. Follow up scans next month.  Follow up in 6 months.  Almira Phetteplace Martinique, MD,FACC  02/28/2016, 4:35 PM

## 2016-03-03 ENCOUNTER — Ambulatory Visit: Payer: Medicare Other | Admitting: Cardiology

## 2016-03-03 ENCOUNTER — Encounter: Payer: Self-pay | Admitting: *Deleted

## 2016-04-06 ENCOUNTER — Ambulatory Visit: Payer: Medicare Other | Admitting: Cardiology

## 2016-04-06 NOTE — Progress Notes (Signed)
Patient Name: Tanner Williamson Date of Encounter: 04/07/2016  Primary Care Provider:  Burnard Bunting, MD Primary Cardiologist:  P. [place], MD   Chief Complaint  71 year old male with a history of ectopic atrial tachycardia who presents for follow up  Past Medical History   Past Medical History:  Diagnosis Date  . Anxiety disorder   . Arthralgia   . Back pain   . Benign essential hypertension   . Bicuspid aortic valve    a. 05/2013 Echo: Mild AS, calcified leaflets.  . Cancer of right lung ([place])    a. 07/2014 status post right upper lobectomy in [place].  . Chronic pain syndrome   . Degeneration of lumbosacral intervertebral disc   . Dysthymia   . Ectopic atrial tachycardia ([place])    a. 2006 s/p RFCA;  b. 04/2005 Cryoablation @ [facility]-->unsuccessful-->Rhythmol.  Marland Kitchen GERD (gastroesophageal reflux disease)   . Hemorrhoids   . Hyperglycemia   . Hyperlipidemia   . Major depressive disorder   . Nonischemic cardiomyopathy ([place])    a. 05/2013 Echo: EF 55-60%, Gr 1 DD.  Marland Kitchen Polyp of colon    by colonoscopy  . Sleep apnea   . Spinal stenosis of lumbar region   . Squamous cell cancer of skin of nose    removed  . Type II diabetes mellitus (HCC)    uncontrolled  . Vitamin D deficiency    Past Surgical History:  Procedure Laterality Date  . CARDIAC CATHETERIZATION  09/07/2004   Est. EF at 40% --  No significant obstructive coronary disease  -- Moderate left ventricular dysfunction  . CAROTID ENDARTERECTOMY      Left carotid endarterectomy with Dacron patch angioplasty.  . CAROTID ENDARTERECTOMY  2003  . CHOLECYSTECTOMY  1998  . INSERT / REPLACE / REMOVE PACEMAKER  03/03/2005   Temporary permanent transvenous pacemaker insertion   . skin cancer removal     nose  . TEE WITH CARDIOVERSION  03/03/2005   confirmed an atrial tachycardia that was originating from the septum somewhere just inferior to the His and superior to the coronary sinus os    Allergies  Allergies    Allergen Reactions  . Codeine Nausea Only and Other (See Comments)    Other Reaction: Other reaction Restlessness & headaches  . Glimepiride Diarrhea    HPI  71 year old male with the above complex problem list. He has a history of ectopic atrial tachycardia and is status post prior unsuccessful radiofrequency catheter ablation in 2006 with subsequent cryoablation at [facility] in 2007. He has been maintained on Rythmol therapy since then and has done quite well without any significant palpitations. He also has a history of mild aortic stenosis in the setting of a bicuspid aortic valve. He  has a history of nonischemic cardiomyopathy.  In May of 2016, patient underwent right upper lobectomy secondary to lung cancer. This was performed in [place]. He initially experienced symptoms of fatigue and loss of appetite but has since recovered. He states his heart races infrequently but never lasts long. He quit smoking one year.  Since his last visit he was diagnosed with prostate CA. He was treated with 40 rounds of RT. He was treated for a UTI. On PET scan he was found to have a spot on his spine and is scheduled for biopsy next week.   Home Medications  Prior to Admission medications   Medication Sig Start Date End Date Taking? Authorizing Provider  alprazolam Duanne Moron) 2 MG tablet Take 0.5 tablets (  1 mg total) by mouth 2 (two) times daily. 05/20/14  Yes Myrl Lazarus M Martinique, MD  carvedilol (COREG) 6.25 MG tablet Take 1 tablet (6.25 mg total) by mouth 2 (two) times daily with a meal. 05/20/14  Yes Berit Raczkowski M [place]ique, MD  digoxin (LANOXIN) 0.125 MG tablet Take 1 tablet (0.125 mg total) by mouth daily. 05/20/14  Yes Ashaun Gaughan M [place]ique, MD  Dulaglutide (TRULICITY) 2.84 XL/2.4MW SOPN Take as directed 05/20/14  Yes Sophronia Varney M [place]ique, MD  ergocalciferol (VITAMIN D2) 50000 UNITS capsule Take 50,000 Units by mouth once a week.   Yes Historical Provider, MD  gabapentin (NEURONTIN) 800 MG tablet Take 800 mg by mouth 2 (two) times  daily.   Yes Historical Provider, MD  hydrocortisone (ANUSOL-HC) 25 MG suppository Place 25 mg rectally 2 (two) times daily.   Yes Historical Provider, MD  metFORMIN (GLUCOPHAGE) 500 MG tablet Take 500 mg by mouth 2 (two) times daily with a meal.   Yes Historical Provider, MD  propafenone (RYTHMOL SR) 325 MG 12 hr capsule Take 1 capsule (325 mg total) by mouth 2 (two) times daily. 05/20/14  Yes Ontario Pettengill M Martinique, MD  simvastatin (ZOCOR) 20 MG tablet Take 1 tablet (20 mg total) by mouth daily. 05/20/14  Yes Teal Raben M [place], MD  traMADol (ULTRAM) 50 MG tablet Take by mouth every 6 (six) hours.   Yes Historical Provider, MD  VOLTAREN 1 % GEL Apply 1 application topically as needed (FOR PAIN).  02/15/14  Yes Historical Provider, MD  quinapril (ACCUPRIL) 10 MG tablet Take 1 tablet (10 mg total) by mouth daily. 10/02/14   Rogelia Mire, NP    Review of Systems  As noted in HPI. All other systems reviewed and are otherwise negative except as noted above.  Physical Exam  VS:  BP 120/68 (BP Location: Right Arm, Patient Position: Sitting, Cuff Size: Normal)   Pulse 80   Ht '5\' 8"'$  (1.727 m)   Wt 188 lb 6.4 oz (85.5 kg)   BMI 28.65 kg/m  , BMI Body mass index is 28.65 kg/m.   GEN: Well nourished, well developed, in no acute distress.  HEENT: normal.  Neck: Supple, no JVD, carotid bruits, or masses. Cardiac: RRR, soft systolic ejection murmur at the right upper sternal border, no rubs, or gallops. No clubbing, cyanosis, edema.  Radials/DP/PT 2+ and equal bilaterally.  Respiratory:  Respirations regular and unlabored, clear to auscultation bilaterally. GI: Soft, nontender, nondistended, BS + x 4. MS: no deformity or atrophy. Skin: warm and dry, no rash. Neuro:  Strength and sensation are intact. Psych: Normal affect.  Accessory Clinical Findings Labs reviewed from 03/27/16: Hgb 13.7. Sodium 131. Glucose 333. Other chemistries normal.  June 16/17: A1c 7%. Cholesterol 138, triglycerides 188, HDL  34, LDL 67  Assessment & Plan  1.   Ectopic atrial tachycardia: Patient is on chronic Rythmol therapy. Well controlled. Continue current therapy  2.  Nonischemic cardiomyopathy.  Lexiscnan myoview shows reduced EF 40% without perfusion abnormality. Echo august 2016 showed EF 45-50% with diffuse hypokinesis. Bicuspid AV with mild AI. Findings are similar to Echo in 2013.  He is on Coreg, dig, and quinapril. Does not appear volume overloaded.   3.  Bicuspid aortic valve/mild aortic stenosis/regurgitation.  4. Lung CA s/p resection. Doing well.   5. Prostate CA s/p XRT. Follow up biopsy of spinal lesion noted on PET.  Follow up in one year  Kisha Messman Martinique, [place]  04/07/2016, 11:59 AM

## 2016-04-07 ENCOUNTER — Ambulatory Visit (INDEPENDENT_AMBULATORY_CARE_PROVIDER_SITE_OTHER): Payer: Medicare Other | Admitting: Cardiology

## 2016-04-07 ENCOUNTER — Encounter: Payer: Self-pay | Admitting: Cardiology

## 2016-04-07 VITALS — BP 120/68 | HR 80 | Ht 68.0 in | Wt 188.4 lb

## 2016-04-07 DIAGNOSIS — Q231 Congenital insufficiency of aortic valve: Secondary | ICD-10-CM | POA: Diagnosis not present

## 2016-04-07 DIAGNOSIS — I428 Other cardiomyopathies: Secondary | ICD-10-CM

## 2016-04-07 DIAGNOSIS — I471 Supraventricular tachycardia: Secondary | ICD-10-CM

## 2016-04-07 DIAGNOSIS — I10 Essential (primary) hypertension: Secondary | ICD-10-CM | POA: Diagnosis not present

## 2016-04-07 IMAGING — NM NM MYOCAR MULTI W/ SPECT
3 series · 18 of 18 positions shown · non-contrast
Comparison: none

[Series 1: rest · 6.51mm/px · 6 of 64 frames shown]
[frame 6/64]
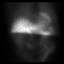
[frame 16/64]
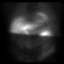
[frame 27/64]
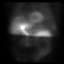
[frame 38/64]
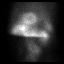
[frame 48/64]
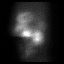
[frame 59/64]
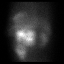

[Series 2: stress · 6.51mm/px · 6 of 512 frames shown (1 of 2)]
[frame 43/512]
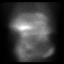
[frame 128/512]
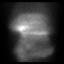
[frame 214/512]
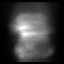
[frame 299/512]
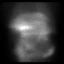
[frame 384/512]
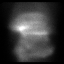
[frame 470/512]
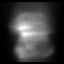

[Series 2: stress · 6.51mm/px · 6 of 64 frames shown (2 of 2)]
[frame 6/64]
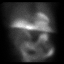
[frame 16/64]
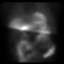
[frame 27/64]
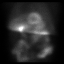
[frame 38/64]
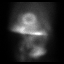
[frame 48/64]
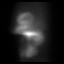
[frame 59/64]
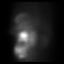

[18 of 18 positions shown; findings below may reference images not displayed]

Canned report from images found in remote index.

Refer to host system for actual result text.

## 2016-04-07 NOTE — Patient Instructions (Signed)
Continue your current therapy  I will see you in one year   

## 2016-05-02 ENCOUNTER — Other Ambulatory Visit: Payer: Self-pay | Admitting: Cardiology

## 2016-05-02 ENCOUNTER — Telehealth: Payer: Self-pay | Admitting: Cardiology

## 2016-05-02 MED ORDER — PROPAFENONE HCL ER 325 MG PO CP12: 325.0000 mg | ORAL_CAPSULE | Freq: Two times a day (BID) | ORAL | 1 refills | Status: DC

## 2016-05-02 NOTE — Telephone Encounter (Signed)
Returned call. Wife notes she actually needs this sent to [facility] in [place]. I have sent this, she's aware, voiced thanks for call.

## 2016-05-02 NOTE — Telephone Encounter (Signed)
Rx(s) sent to pharmacy electronically.  

## 2016-05-02 NOTE — Telephone Encounter (Signed)
F/u Message   *STAT* If patient is at the pharmacy, call can be transferred to refill team.   1. Which medications need to be refilled? (please list name of each medication and dose if known) Propafenone '325mg'$    2. Which pharmacy/location (including street and city if local pharmacy) is medication to be sent to? [facility]   3. Do they need a 30 day or 90 day supply? '90mg'$   Per pt wife pt is out of medication.

## 2016-05-02 NOTE — Telephone Encounter (Signed)
Left msg that refill would be sent to [facility] as requested, if further needs or questions to call.

## 2016-05-02 NOTE — Telephone Encounter (Signed)
New message    Pt wife is requesting a call. She would not leave a message.

## 2016-05-02 NOTE — Telephone Encounter (Signed)
Returning your call. °

## 2016-07-14 DIAGNOSIS — Z85118 Personal history of other malignant neoplasm of bronchus and lung: Secondary | ICD-10-CM | POA: Diagnosis not present

## 2016-07-14 DIAGNOSIS — C7951 Secondary malignant neoplasm of bone: Secondary | ICD-10-CM

## 2016-07-14 DIAGNOSIS — C61 Malignant neoplasm of prostate: Secondary | ICD-10-CM | POA: Diagnosis not present

## 2016-07-17 ENCOUNTER — Other Ambulatory Visit: Payer: Self-pay | Admitting: Cardiology

## 2016-07-21 DIAGNOSIS — C61 Malignant neoplasm of prostate: Secondary | ICD-10-CM | POA: Diagnosis not present

## 2016-07-21 DIAGNOSIS — C7951 Secondary malignant neoplasm of bone: Secondary | ICD-10-CM | POA: Diagnosis not present

## 2016-08-21 ENCOUNTER — Telehealth: Payer: Self-pay | Admitting: Cardiology

## 2016-08-21 DIAGNOSIS — C7951 Secondary malignant neoplasm of bone: Secondary | ICD-10-CM

## 2016-08-21 DIAGNOSIS — C61 Malignant neoplasm of prostate: Secondary | ICD-10-CM | POA: Diagnosis not present

## 2016-08-21 DIAGNOSIS — F329 Major depressive disorder, single episode, unspecified: Secondary | ICD-10-CM | POA: Diagnosis not present

## 2016-08-21 NOTE — Telephone Encounter (Signed)
Pt's wife calling regarding getting the number aneurysm he has, he is very weak and wonders if it has to do with that, also has appt with cancer doc at 43, and I asked her to let them know as well as the cancer may also be the reason for the fatigue-pls call

## 2016-08-21 NOTE — Telephone Encounter (Signed)
Lm2cb 

## 2016-08-22 NOTE — Telephone Encounter (Signed)
Follow up    Pt wife is calling about phone call yesterday.

## 2016-08-22 NOTE — Telephone Encounter (Signed)
Left message to call back, per DPR.

## 2016-08-23 NOTE — Telephone Encounter (Signed)
Left msg to call.

## 2016-08-24 NOTE — Telephone Encounter (Signed)
Left message

## 2016-08-31 ENCOUNTER — Telehealth: Payer: Self-pay | Admitting: Cardiology

## 2016-08-31 NOTE — Telephone Encounter (Signed)
New message     Pt is on 3 medication for bp and they one to take one pill away and given clonidine .1mg  -pt is having hot flashes

## 2016-08-31 NOTE — Telephone Encounter (Signed)
Tanner Williamson returning your call.

## 2016-08-31 NOTE — Telephone Encounter (Signed)
Attempted to reach caller, goes to VM - left msg w advice to call.

## 2016-08-31 NOTE — Telephone Encounter (Signed)
Spoke to [place]. She works w Dr. Orlene Erm, pt's oncologist.  Wanted to know if patient can be stopped on one of his BP meds in favor of clonidine, which they think will help w BO control, as well as the hot flashes he's been having as side effect of radiation therapy. If so, need recommendation for which med (propafenone, quinapril, or coreg) would be OK to discontinue and what dose of clonidine would be OK to take. Aware I will route for review and follow up call.

## 2016-08-31 NOTE — Telephone Encounter (Signed)
Spoke w Raquel -- Quinapril should be d/c'ed & Clonidine should be initiated at 0.1mg  BID w further titration in office once he's had sufficient time to get used to med (1-2 weeks)

## 2016-08-31 NOTE — Telephone Encounter (Signed)
May stop quinapril and start clonidine 0.1mg . Patient needs BP monitor to adjust dose as needed.  Propafenone and carvedilol to continue due to hx of ectopic atrial tachycardia.

## 2016-08-31 NOTE — Telephone Encounter (Signed)
Left msg for Katrina to call regarding recommendations.

## 2016-09-01 ENCOUNTER — Telehealth: Payer: Self-pay | Admitting: Cardiology

## 2016-09-01 NOTE — Telephone Encounter (Signed)
Per NR tele Note dated 6-28-@4 :48pm    Spoke w Raquel -- Quinapril should be d/c'ed & Clonidine should be initiated at 0.1mg  BID w further titration in office once he's had sufficient time to get used to med (1-2 weeks)     Katrina notified, she will have pt call and schedule appt

## 2016-09-01 NOTE — Telephone Encounter (Signed)
New message    Katrina from the [facility] is calling Ovid Curd back about pt. Please call.

## 2016-09-24 ENCOUNTER — Other Ambulatory Visit: Payer: Self-pay | Admitting: Cardiology

## 2016-09-25 NOTE — Telephone Encounter (Signed)
REFILL 

## 2016-09-28 DIAGNOSIS — C61 Malignant neoplasm of prostate: Secondary | ICD-10-CM | POA: Diagnosis not present

## 2016-09-28 DIAGNOSIS — C7951 Secondary malignant neoplasm of bone: Secondary | ICD-10-CM | POA: Diagnosis not present

## 2016-09-28 DIAGNOSIS — F329 Major depressive disorder, single episode, unspecified: Secondary | ICD-10-CM | POA: Diagnosis not present

## 2016-09-28 DIAGNOSIS — R972 Elevated prostate specific antigen [PSA]: Secondary | ICD-10-CM | POA: Diagnosis not present

## 2016-10-30 DIAGNOSIS — C7951 Secondary malignant neoplasm of bone: Secondary | ICD-10-CM

## 2016-10-30 DIAGNOSIS — C61 Malignant neoplasm of prostate: Secondary | ICD-10-CM | POA: Diagnosis not present

## 2016-10-30 DIAGNOSIS — M25551 Pain in right hip: Secondary | ICD-10-CM | POA: Diagnosis not present

## 2016-11-23 ENCOUNTER — Telehealth: Payer: Self-pay | Admitting: Cardiology

## 2016-11-23 NOTE — Telephone Encounter (Signed)
Pt of Dr. Martinique Hx of cardiomyopathy, ectopic atrial tach, bicuspid aortic valve  Spoke w 'Nimit' (Dr. Olive Bass) at [facility] He is seeing pt in hospital for syncope/near syncope probably resultant from vasovagal episode/dehydration. States pt gave narrative of working out in the yard today for several hours, did not consume much fluid, went inside and felt faint but denies true syncope. States UA and bloodwork corrorobate likely dehydration. He was aware of pt's use of rhythmol and digoxin, and so called to get more information about heart history. We also discussed last OV (04/2016) and last echo (dated 10/2014).  I offered to fax any requested notes and reach out to Dr. Martinique and/or Dr. Percival Spanish ([facility]) to discuss, he declined and states probably not necessary, he would plan to discharge pt w instruction to follow up w his PCP and/or cardiology in next week or so.   I did offer to schedule w PA while on phone but this was politely declined. Per DO, pt will be given instruction to call our office at his convenience.  Routed to Dr. Martinique for any further instruction.

## 2016-11-23 NOTE — Telephone Encounter (Signed)
New message  Nimit verbalized that he is trying to get the recent ECHO results  Of patient

## 2016-11-24 NOTE — Telephone Encounter (Signed)
Returned call to Nimit with [facility] message on Dr.Patidar's nurses personal voice mail Dr.Jordan's recommendations.

## 2016-11-24 NOTE — Telephone Encounter (Signed)
Reviewed records from hospital in [place] with DC plan  Peter Martinique MD, [facility]

## 2016-11-27 ENCOUNTER — Telehealth: Payer: Self-pay | Admitting: Cardiology

## 2016-11-27 NOTE — Telephone Encounter (Signed)
Spoke w wife - she wanted to make sure records had been received from [facility] concerning the ER visit pt had last week. Informed her per notes, Dr. Martinique had reviewed, did not appear that patient needs a follow up based on notes.   Routed for any recommendations. If pt needs f/u please advise - otherwise he will follow up as planned in February.

## 2016-11-27 NOTE — Telephone Encounter (Signed)
New Message   pt wife verbalized that she is calling for rn   Its pertaining to an ER visit 11-22-2016

## 2017-01-01 DIAGNOSIS — C7951 Secondary malignant neoplasm of bone: Secondary | ICD-10-CM | POA: Diagnosis not present

## 2017-01-01 DIAGNOSIS — M549 Dorsalgia, unspecified: Secondary | ICD-10-CM | POA: Diagnosis not present

## 2017-01-01 DIAGNOSIS — C61 Malignant neoplasm of prostate: Secondary | ICD-10-CM | POA: Diagnosis not present

## 2017-01-09 DIAGNOSIS — C7951 Secondary malignant neoplasm of bone: Secondary | ICD-10-CM | POA: Diagnosis not present

## 2017-01-09 DIAGNOSIS — C61 Malignant neoplasm of prostate: Secondary | ICD-10-CM | POA: Diagnosis not present

## 2017-01-23 DIAGNOSIS — C61 Malignant neoplasm of prostate: Secondary | ICD-10-CM | POA: Diagnosis not present

## 2017-01-23 DIAGNOSIS — C7951 Secondary malignant neoplasm of bone: Secondary | ICD-10-CM | POA: Diagnosis not present

## 2017-01-23 DIAGNOSIS — C787 Secondary malignant neoplasm of liver and intrahepatic bile duct: Secondary | ICD-10-CM

## 2017-01-23 DIAGNOSIS — M25551 Pain in right hip: Secondary | ICD-10-CM | POA: Diagnosis not present

## 2017-01-29 ENCOUNTER — Telehealth: Payer: Self-pay | Admitting: Cardiology

## 2017-01-29 NOTE — Telephone Encounter (Signed)
New Message   *STAT* If patient is at the pharmacy, call can be transferred to refill team.   1. Which medications need to be refilled? (please list name of each medication and dose if known) Xanax 2mg    2. Which pharmacy/location (including street and city if local pharmacy) is medication to be sent to? [facility] [address] [place]   3. Do they need a 30 day or 90 day supply? [place]

## 2017-01-29 NOTE — Telephone Encounter (Signed)
Refill phoned to the pharmacy

## 2017-01-30 ENCOUNTER — Other Ambulatory Visit: Payer: Self-pay

## 2017-01-30 DIAGNOSIS — C7951 Secondary malignant neoplasm of bone: Secondary | ICD-10-CM

## 2017-01-30 DIAGNOSIS — C787 Secondary malignant neoplasm of liver and intrahepatic bile duct: Secondary | ICD-10-CM | POA: Diagnosis not present

## 2017-01-30 DIAGNOSIS — C61 Malignant neoplasm of prostate: Secondary | ICD-10-CM

## 2017-01-30 MED ORDER — ALPRAZOLAM 2 MG PO TABS: 1.0000 mg | ORAL_TABLET | Freq: Two times a day (BID) | ORAL | 2 refills | Status: DC

## 2017-02-23 DIAGNOSIS — C61 Malignant neoplasm of prostate: Secondary | ICD-10-CM | POA: Diagnosis not present

## 2017-02-23 DIAGNOSIS — C787 Secondary malignant neoplasm of liver and intrahepatic bile duct: Secondary | ICD-10-CM | POA: Diagnosis not present

## 2017-02-23 DIAGNOSIS — M25551 Pain in right hip: Secondary | ICD-10-CM

## 2017-02-23 DIAGNOSIS — C7951 Secondary malignant neoplasm of bone: Secondary | ICD-10-CM | POA: Diagnosis not present

## 2017-03-08 ENCOUNTER — Other Ambulatory Visit: Payer: Self-pay | Admitting: Cardiology

## 2017-03-16 DIAGNOSIS — G893 Neoplasm related pain (acute) (chronic): Secondary | ICD-10-CM | POA: Diagnosis not present

## 2017-03-16 DIAGNOSIS — C7951 Secondary malignant neoplasm of bone: Secondary | ICD-10-CM

## 2017-03-16 DIAGNOSIS — C61 Malignant neoplasm of prostate: Secondary | ICD-10-CM | POA: Diagnosis not present

## 2017-04-02 NOTE — Progress Notes (Deleted)
Patient Name: Tanner Williamson Date of Encounter: 04/02/2017  Primary Care Provider:  Burnard Bunting, MD Primary Cardiologist:  P. Martinique, MD   Chief Complaint  72 year old male with a history of ectopic atrial tachycardia who presents for follow up  Past Medical History   Past Medical History:  Diagnosis Date  . Anxiety disorder   . Arthralgia   . Back pain   . Benign essential hypertension   . Bicuspid aortic valve    a. 05/2013 Echo: Mild AS, calcified leaflets.  . Cancer of right lung ([place])    a. 07/2014 status post right upper lobectomy in [place].  . Chronic pain syndrome   . Degeneration of lumbosacral intervertebral disc   . Dysthymia   . Ectopic atrial tachycardia ([place])    a. 2006 s/p RFCA;  b. 04/2005 Cryoablation @ [facility]-->unsuccessful-->Rhythmol.  Marland Kitchen GERD (gastroesophageal reflux disease)   . Hemorrhoids   . Hyperglycemia   . Hyperlipidemia   . Major depressive disorder   . Nonischemic cardiomyopathy ([place])    a. 05/2013 Echo: EF 55-60%, Gr 1 DD.  Marland Kitchen Polyp of colon    by colonoscopy  . Sleep apnea   . Spinal stenosis of lumbar region   . Squamous cell cancer of skin of nose    removed  . Type II diabetes mellitus ([facility])    uncontrolled  . Vitamin D deficiency    Past Surgical History:  Procedure Laterality Date  . CARDIAC CATHETERIZATION  09/07/2004   Est. EF at 40% --  No significant obstructive coronary disease  -- Moderate left ventricular dysfunction  . CAROTID ENDARTERECTOMY      Left carotid endarterectomy with Dacron patch angioplasty.  . CAROTID ENDARTERECTOMY  2003  . CHOLECYSTECTOMY  1998  . INSERT / REPLACE / REMOVE PACEMAKER  03/03/2005   Temporary permanent transvenous pacemaker insertion   . skin cancer removal     nose  . TEE WITH CARDIOVERSION  03/03/2005   confirmed an atrial tachycardia that was originating from the septum somewhere just inferior to the His and superior to the coronary sinus os    Allergies  Allergies    Allergen Reactions  . Codeine Nausea Only and Other (See Comments)    Other Reaction: Other reaction Restlessness & headaches  . Glimepiride Diarrhea    HPI  72 year old male with the above complex problem list. He has a history of ectopic atrial tachycardia and is status post prior unsuccessful radiofrequency catheter ablation in 2006 with subsequent cryoablation at [facility] in 2007. He has been maintained on Rythmol therapy since then and has done quite well without any significant palpitations. He also has a history of mild aortic stenosis in the setting of a bicuspid aortic valve. He  has a history of nonischemic cardiomyopathy.  In May of 2016, patient underwent right upper lobectomy secondary to lung cancer. This was performed in [place]. He initially experienced symptoms of fatigue and loss of appetite but has since recovered. He states his heart races infrequently but never lasts long. He quit smoking one year.  Since his last visit he was diagnosed with prostate CA. He was treated with 40 rounds of RT. He was treated for a UTI. On PET scan he was found to have a spot on his spine biopsy confirmed L1 metastatic disease. May also have a lesion right 7th rib.   He was admitted to the hospital in September in [place] with near syncope and dehydration. Was hydrated with improvement.  Home Medications  Prior to Admission medications   Medication Sig Start Date End Date Taking? Authorizing Provider  alprazolam Duanne Moron) 2 MG tablet Take 0.5 tablets (1 mg total) by mouth 2 (two) times daily. 05/20/14  Yes Charish Schroepfer M Martinique, MD  carvedilol (COREG) 6.25 MG tablet Take 1 tablet (6.25 mg total) by mouth 2 (two) times daily with a meal. 05/20/14  Yes Lacee Grey M [place]ique, MD  digoxin (LANOXIN) 0.125 MG tablet Take 1 tablet (0.125 mg total) by mouth daily. 05/20/14  Yes Titus Drone M Martinique, MD  Dulaglutide (TRULICITY) 4.16 SA/6.3KZ SOPN Take as directed 05/20/14  Yes Jahlil Ziller M [place], MD  ergocalciferol (VITAMIN  D2) 50000 UNITS capsule Take 50,000 Units by mouth once a week.   Yes Historical Provider, MD  gabapentin (NEURONTIN) 800 MG tablet Take 800 mg by mouth 2 (two) times daily.   Yes Historical Provider, MD  hydrocortisone (ANUSOL-HC) 25 MG suppository Place 25 mg rectally 2 (two) times daily.   Yes Historical Provider, MD  metFORMIN (GLUCOPHAGE) 500 MG tablet Take 500 mg by mouth 2 (two) times daily with a meal.   Yes Historical Provider, MD  propafenone (RYTHMOL SR) 325 MG 12 hr capsule Take 1 capsule (325 mg total) by mouth 2 (two) times daily. 05/20/14  Yes Carman Essick M Martinique, MD  simvastatin (ZOCOR) 20 MG tablet Take 1 tablet (20 mg total) by mouth daily. 05/20/14  Yes Yurianna Tusing M [place]ique, MD  traMADol (ULTRAM) 50 MG tablet Take by mouth every 6 (six) hours.   Yes Historical Provider, MD  VOLTAREN 1 % GEL Apply 1 application topically as needed (FOR PAIN).  02/15/14  Yes Historical Provider, MD  quinapril (ACCUPRIL) 10 MG tablet Take 1 tablet (10 mg total) by mouth daily. 10/02/14   Rogelia Mire, NP    Review of Systems  As noted in HPI. All other systems reviewed and are otherwise negative except as noted above.  Physical Exam  VS:  There were no vitals taken for this visit. , BMI There is no height or weight on file to calculate BMI.   GEN: Well nourished, well developed, in no acute distress.  HEENT: normal.  Neck: Supple, no JVD, carotid bruits, or masses. Cardiac: RRR, soft systolic ejection murmur at the right upper sternal border, no rubs, or gallops. No clubbing, cyanosis, edema.  Radials/DP/PT 2+ and equal bilaterally.  Respiratory:  Respirations regular and unlabored, clear to auscultation bilaterally. GI: Soft, nontender, nondistended, BS + x 4. MS: no deformity or atrophy. Skin: warm and dry, no rash. Neuro:  Strength and sensation are intact. Psych: Normal affect.  Accessory Clinical Findings Labs reviewed from 03/27/16: Hgb 13.7. Sodium 131. Glucose 333. Other chemistries  normal.  June 16/17: A1c 7%. Cholesterol 138, triglycerides 188, HDL 34, LDL 67 From 06/30/16: normal chemistry panel.   Assessment & Plan  1.   Ectopic atrial tachycardia: Patient is on chronic Rythmol therapy. Well controlled. Continue current therapy  2.  Nonischemic cardiomyopathy.  Lexiscnan myoview shows reduced EF 40% without perfusion abnormality. Echo august 2016 showed EF 45-50% with diffuse hypokinesis. Bicuspid AV with mild AI. Findings are similar to Echo in 2013.  He is on Coreg, dig, and quinapril. Does not appear volume overloaded.   3.  Bicuspid aortic valve/mild aortic stenosis/regurgitation.  4. Lung CA s/p resection. Doing well.   5. Prostate CA s/p XRT. Follow up biopsy of spinal lesion noted on PET.  Follow up in one year  Diana Davenport Martinique, [place]  04/02/2017, 7:50  AM

## 2017-04-06 DIAGNOSIS — C7951 Secondary malignant neoplasm of bone: Secondary | ICD-10-CM | POA: Diagnosis not present

## 2017-04-06 DIAGNOSIS — C61 Malignant neoplasm of prostate: Secondary | ICD-10-CM | POA: Diagnosis not present

## 2017-04-06 DIAGNOSIS — C787 Secondary malignant neoplasm of liver and intrahepatic bile duct: Secondary | ICD-10-CM

## 2017-04-10 ENCOUNTER — Ambulatory Visit: Payer: Medicare Other | Admitting: Cardiology

## 2017-05-11 ENCOUNTER — Other Ambulatory Visit: Payer: Self-pay | Admitting: Cardiology

## 2017-05-14 ENCOUNTER — Other Ambulatory Visit: Payer: Self-pay | Admitting: Cardiology

## 2017-05-14 NOTE — Telephone Encounter (Signed)
REFILL 

## 2017-05-24 ENCOUNTER — Telehealth: Payer: Self-pay | Admitting: Cardiology

## 2017-05-24 NOTE — Telephone Encounter (Signed)
New message    Per wife patient has been diagnosed with terminal cancer and they have only gave him 6 months.   *STAT* If patient is at the pharmacy, call can be transferred to refill team.   1. Which medications need to be refilled? (please list name of each medication and dose if known)   alprazolam (XANAX) 2 MG tablet Take 0.5 tablets (1 mg total) by mouth 2 (two) times daily.     digoxin (DIGOX) 0.125 MG tablet Take 1 tablet (0.125 mg total) by mouth daily.     2. Which pharmacy/location (including street and city if local pharmacy) is medication to be sent to?  [facility] [address]   3. Do they need a 30 day or 90 day supply? [place]

## 2017-05-25 ENCOUNTER — Encounter: Payer: Self-pay | Admitting: Cardiology

## 2017-05-25 MED ORDER — DIGOXIN 125 MCG PO TABS: 0.1250 mg | ORAL_TABLET | Freq: Every day | ORAL | 0 refills | Status: AC

## 2017-05-25 MED ORDER — ALPRAZOLAM 2 MG PO TABS: 1.0000 mg | ORAL_TABLET | Freq: Two times a day (BID) | ORAL | 2 refills | Status: AC

## 2017-05-25 NOTE — Telephone Encounter (Signed)
Xanax refill phoned into pharmacy.

## 2017-05-25 NOTE — Telephone Encounter (Signed)
OK to fill his Xanax as well.  Jamez Ambrocio Martinique MD, [facility]

## 2017-05-25 NOTE — Addendum Note (Signed)
Addended by: Kathyrn Lass on: 05/25/2017 05:22 PM   Modules accepted: Orders

## 2017-06-04 ENCOUNTER — Telehealth: Payer: Self-pay | Admitting: Cardiology

## 2017-06-04 NOTE — Progress Notes (Deleted)
Patient Name: Tanner Williamson Date of Encounter: 06/04/2017  Primary Care Provider:  Burnard Bunting, MD Primary Cardiologist:  P. Martinique, MD   Chief Complaint  72 year old male with a history of ectopic atrial tachycardia who presents for follow up  Past Medical History   Past Medical History:  Diagnosis Date  . Anxiety disorder   . Arthralgia   . Back pain   . Benign essential hypertension   . Bicuspid aortic valve    a. 05/2013 Echo: Mild AS, calcified leaflets.  . Cancer of right lung ([place])    a. 07/2014 status post right upper lobectomy in [place].  . Chronic pain syndrome   . Degeneration of lumbosacral intervertebral disc   . Dysthymia   . Ectopic atrial tachycardia ([place])    a. 2006 s/p RFCA;  b. 04/2005 Cryoablation @ [facility]-->unsuccessful-->Rhythmol.  Marland Kitchen GERD (gastroesophageal reflux disease)   . Hemorrhoids   . Hyperglycemia   . Hyperlipidemia   . Major depressive disorder   . Nonischemic cardiomyopathy ([place])    a. 05/2013 Echo: EF 55-60%, Gr 1 DD.  Marland Kitchen Polyp of colon    by colonoscopy  . Sleep apnea   . Spinal stenosis of lumbar region   . Squamous cell cancer of skin of nose    removed  . Type II diabetes mellitus ([facility])    uncontrolled  . Vitamin D deficiency    Past Surgical History:  Procedure Laterality Date  . CARDIAC CATHETERIZATION  09/07/2004   Est. EF at 40% --  No significant obstructive coronary disease  -- Moderate left ventricular dysfunction  . CAROTID ENDARTERECTOMY      Left carotid endarterectomy with Dacron patch angioplasty.  . CAROTID ENDARTERECTOMY  2003  . CHOLECYSTECTOMY  1998  . INSERT / REPLACE / REMOVE PACEMAKER  03/03/2005   Temporary permanent transvenous pacemaker insertion   . skin cancer removal     nose  . TEE WITH CARDIOVERSION  03/03/2005   confirmed an atrial tachycardia that was originating from the septum somewhere just inferior to the His and superior to the coronary sinus os    Allergies  Allergies    Allergen Reactions  . Codeine Nausea Only and Other (See Comments)    Other Reaction: Other reaction Restlessness & headaches  . Glimepiride Diarrhea    HPI  72 year old male with the above complex problem list. He has a history of ectopic atrial tachycardia and is status post prior unsuccessful radiofrequency catheter ablation in 2006 with subsequent cryoablation at [facility] in 2007. He has been maintained on Rythmol therapy since then and has done quite well without any significant palpitations. He also has a history of mild aortic stenosis in the setting of a bicuspid aortic valve. He  has a history of nonischemic cardiomyopathy.  In May of 2016, patient underwent right upper lobectomy secondary to lung cancer. This was performed in [place]. He initially experienced symptoms of fatigue and loss of appetite but has since recovered. He states his heart races infrequently but never lasts long. He quit smoking one year.  Since his last visit he was diagnosed with prostate CA. He was treated with 40 rounds of RT. He was treated for a UTI. On PET scan he was found to have a spot on his spine and biopsy confirmed this was metastatic CA. On CT in September there was concern for metastatic involvement of the liver and right 7th rib.   Home Medications  Prior to Admission medications   Medication  Sig Start Date End Date Taking? Authorizing Provider  alprazolam Duanne Moron) 2 MG tablet Take 0.5 tablets (1 mg total) by mouth 2 (two) times daily. 05/20/14  Yes Akirra Lacerda M Martinique, MD  carvedilol (COREG) 6.25 MG tablet Take 1 tablet (6.25 mg total) by mouth 2 (two) times daily with a meal. 05/20/14  Yes Kawanda Drumheller M Martinique, MD  digoxin (LANOXIN) 0.125 MG tablet Take 1 tablet (0.125 mg total) by mouth daily. 05/20/14  Yes Hien Cunliffe M [place], MD  Dulaglutide (TRULICITY) 9.56 OZ/3.0QM SOPN Take as directed 05/20/14  Yes Adem Costlow M Martinique, MD  ergocalciferol (VITAMIN D2) 50000 UNITS capsule Take 50,000 Units by mouth once a week.    Yes Historical Provider, MD  gabapentin (NEURONTIN) 800 MG tablet Take 800 mg by mouth 2 (two) times daily.   Yes Historical Provider, MD  hydrocortisone (ANUSOL-HC) 25 MG suppository Place 25 mg rectally 2 (two) times daily.   Yes Historical Provider, MD  metFORMIN (GLUCOPHAGE) 500 MG tablet Take 500 mg by mouth 2 (two) times daily with a meal.   Yes Historical Provider, MD  propafenone (RYTHMOL SR) 325 MG 12 hr capsule Take 1 capsule (325 mg total) by mouth 2 (two) times daily. 05/20/14  Yes Tova Vater M Martinique, MD  simvastatin (ZOCOR) 20 MG tablet Take 1 tablet (20 mg total) by mouth daily. 05/20/14  Yes Halina Asano M [place]ique, MD  traMADol (ULTRAM) 50 MG tablet Take by mouth every 6 (six) hours.   Yes Historical Provider, MD  VOLTAREN 1 % GEL Apply 1 application topically as needed (FOR PAIN).  02/15/14  Yes Historical Provider, MD  quinapril (ACCUPRIL) 10 MG tablet Take 1 tablet (10 mg total) by mouth daily. 10/02/14   Rogelia Mire, NP    Review of Systems  As noted in HPI. All other systems reviewed and are otherwise negative except as noted above.  Physical Exam  VS:  There were no vitals taken for this visit. , BMI There is no height or weight on file to calculate BMI.   GEN: Well nourished, well developed, in no acute distress.  HEENT: normal.  Neck: Supple, no JVD, carotid bruits, or masses. Cardiac: RRR, soft systolic ejection murmur at the right upper sternal border, no rubs, or gallops. No clubbing, cyanosis, edema.  Radials/DP/PT 2+ and equal bilaterally.  Respiratory:  Respirations regular and unlabored, clear to auscultation bilaterally. GI: Soft, nontender, nondistended, BS + x 4. MS: no deformity or atrophy. Skin: warm and dry, no rash. Neuro:  Strength and sensation are intact. Psych: Normal affect.  Accessory Clinical Findings Labs reviewed from 03/27/16: Hgb 13.7. Sodium 131. Glucose 333. Other chemistries normal.  June 16/17: A1c 7%. Cholesterol 138, triglycerides 188, HDL  34, LDL 67  Myoview 10/11/16: Study Highlights    Nuclear stress EF: 38%.  There was no ST segment deviation noted during stress.  Defect 1: There is a fixed defect present in the apex location. No ischemia.     Echo 10/22/14: Study Conclusions  - Left ventricle: The cavity size was normal. There was mild focal   basal hypertrophy of the septum. Systolic function was mildly   reduced. The estimated ejection fraction was in the range of 45%   to 50%. Diffuse hypokinesis. Doppler parameters are consistent   with abnormal left ventricular relaxation (grade 1 diastolic   dysfunction). - Aortic valve: Possibly bicuspid; moderately thickened, moderately   calcified leaflets. Cusp separation was reduced. Transvalvular   velocity was minimally increased. There was no stenosis. There  was mild regurgitation directed eccentrically in the LVOT. Mean   gradient (S): 9 mm Hg. Peak gradient (S): 18 mm Hg. Valve area   (VTI): 2.33 cm^2. Valve area (Vmax): 2.1 cm^2. Valve area   (Vmean): 2.38 cm^2. - Left atrium: The atrium was mildly dilated.  Assessment & Plan  1.   Ectopic atrial tachycardia: Patient is on chronic Rythmol therapy. Well controlled. Continue current therapy  2.  Nonischemic cardiomyopathy.  Lexiscnan myoview shows reduced EF 40% without perfusion abnormality. Echo august 2016 showed EF 45-50% with diffuse hypokinesis. Bicuspid AV with mild AI. Findings are similar to Echo in 2013.  He is on Coreg, dig, and quinapril. Does not appear volume overloaded.   3.  Bicuspid aortic valve/mild aortic stenosis/regurgitation.  4. Lung CA s/p resection. Doing well.   5. Prostate CA s/p XRT. Metastatic to bone, ? Liver   Follow up in one year  Anjani Feuerborn Martinique, [place]  06/04/2017, 8:41 AM

## 2017-06-04 NOTE — Telephone Encounter (Signed)
Mr.Canal Passed Away on 2017/06/19 .

## 2017-06-04 DEATH — deceased

## 2017-06-05 ENCOUNTER — Ambulatory Visit: Payer: Medicare Other | Admitting: Cardiology
